# Patient Record
Sex: Male | Born: 1970 | Race: Black or African American | Marital: Single | Smoking: Never smoker
Health system: Southern US, Community
[De-identification: ages and names within clinical notes are randomized; demographics above are authoritative.]

## PROBLEM LIST (undated history)

## (undated) DIAGNOSIS — I10 Essential (primary) hypertension: Secondary | ICD-10-CM

---

## 2017-03-11 ENCOUNTER — Emergency Department: Payer: Self-pay

## 2017-03-11 ENCOUNTER — Observation Stay: Payer: Self-pay

## 2017-03-11 ENCOUNTER — Observation Stay
Admission: EM | Admit: 2017-03-11 | Discharge: 2017-03-12 | Disposition: A | Discharge status: Home / Self Care | Payer: Self-pay | Attending: Internal Medicine | Admitting: Internal Medicine

## 2017-03-11 DIAGNOSIS — Z9114 Patient's other noncompliance with medication regimen: Secondary | ICD-10-CM | POA: Insufficient documentation

## 2017-03-11 DIAGNOSIS — I129 Hypertensive chronic kidney disease with stage 1 through stage 4 chronic kidney disease, or unspecified chronic kidney disease: Secondary | ICD-10-CM | POA: Insufficient documentation

## 2017-03-11 DIAGNOSIS — R109 Unspecified abdominal pain: Secondary | ICD-10-CM

## 2017-03-11 DIAGNOSIS — M549 Dorsalgia, unspecified: Secondary | ICD-10-CM

## 2017-03-11 DIAGNOSIS — N183 Chronic kidney disease, stage 3 unspecified: Secondary | ICD-10-CM

## 2017-03-11 DIAGNOSIS — N200 Calculus of kidney: Secondary | ICD-10-CM

## 2017-03-11 DIAGNOSIS — Z791 Long term (current) use of non-steroidal anti-inflammatories (NSAID): Secondary | ICD-10-CM | POA: Insufficient documentation

## 2017-03-11 DIAGNOSIS — I160 Hypertensive urgency: Principal | ICD-10-CM | POA: Insufficient documentation

## 2017-03-11 DIAGNOSIS — N132 Hydronephrosis with renal and ureteral calculous obstruction: Secondary | ICD-10-CM | POA: Insufficient documentation

## 2017-03-11 DIAGNOSIS — R9431 Abnormal electrocardiogram [ECG] [EKG]: Secondary | ICD-10-CM

## 2017-03-11 DIAGNOSIS — N201 Calculus of ureter: Secondary | ICD-10-CM

## 2017-03-11 DIAGNOSIS — I10 Essential (primary) hypertension: Secondary | ICD-10-CM

## 2017-03-11 HISTORY — DX: Essential (primary) hypertension: I10

## 2017-03-11 LAB — CBC WITH DIFFERENTIAL/PLATELET
BASOS PCT: 1 %
Basophils Absolute: 0.1 10*3/uL (ref 0–0.1)
EOS ABS: 0 10*3/uL (ref 0–0.7)
Eosinophils Relative: 0 %
HCT: 46 % (ref 40.0–52.0)
HEMOGLOBIN: 15.7 g/dL (ref 13.0–18.0)
Lymphocytes Relative: 10 %
Lymphs Abs: 1.6 10*3/uL (ref 1.0–3.6)
MCH: 31.2 pg (ref 26.0–34.0)
MCHC: 34.2 g/dL (ref 32.0–36.0)
MCV: 91.2 fL (ref 80.0–100.0)
MONO ABS: 1.2 10*3/uL — AB (ref 0.2–1.0)
MONOS PCT: 8 %
Neutro Abs: 12.5 10*3/uL — ABNORMAL HIGH (ref 1.4–6.5)
Neutrophils Relative %: 81 %
Platelets: 286 10*3/uL (ref 150–440)
RBC: 5.05 MIL/uL (ref 4.40–5.90)
RDW: 12.8 % (ref 11.5–14.5)
WBC: 15.4 10*3/uL — ABNORMAL HIGH (ref 3.8–10.6)

## 2017-03-11 LAB — COMPREHENSIVE METABOLIC PANEL
ALK PHOS: 57 U/L (ref 38–126)
ALT: 30 U/L (ref 17–63)
AST: 39 U/L (ref 15–41)
Albumin: 4.7 g/dL (ref 3.5–5.0)
Anion gap: 9 (ref 5–15)
BUN: 15 mg/dL (ref 6–20)
CALCIUM: 9.9 mg/dL (ref 8.9–10.3)
CO2: 24 mmol/L (ref 22–32)
CREATININE: 1.46 mg/dL — AB (ref 0.61–1.24)
Chloride: 107 mmol/L (ref 101–111)
GFR, EST NON AFRICAN AMERICAN: 57 mL/min — AB (ref >60)
Glucose, Bld: 132 mg/dL — ABNORMAL HIGH (ref 65–99)
Potassium: 3.9 mmol/L (ref 3.5–5.1)
Sodium: 140 mmol/L (ref 135–145)
Total Bilirubin: 1.4 mg/dL — ABNORMAL HIGH (ref 0.3–1.2)
Total Protein: 7.8 g/dL (ref 6.5–8.1)

## 2017-03-11 LAB — URINALYSIS, COMPLETE (UACMP) WITH MICROSCOPIC
BILIRUBIN URINE: NEGATIVE
Bacteria, UA: NONE SEEN
Glucose, UA: NEGATIVE mg/dL
KETONES UR: 5 mg/dL — AB
Leukocytes, UA: NEGATIVE
Nitrite: NEGATIVE
PROTEIN: 30 mg/dL — AB
Specific Gravity, Urine: 1.028 (ref 1.005–1.030)
pH: 7 (ref 5.0–8.0)

## 2017-03-11 LAB — TROPONIN I: Troponin I: <0.03 ng/mL (ref <0.03)

## 2017-03-11 LAB — LIPASE, BLOOD: LIPASE: 36 U/L (ref 11–51)

## 2017-03-11 MED ORDER — NITROGLYCERIN 2 % TD OINT
2.0000 [in_us] | TOPICAL_OINTMENT | Freq: Four times a day (QID) | TRANSDERMAL | Status: DC
  Administered 2017-03-11 – 2017-03-12 (×4): 2 [in_us] via TOPICAL
  Filled 2017-03-11: qty 30
  Filled 2017-03-11 (×3): qty 2

## 2017-03-11 MED ORDER — HYDROMORPHONE HCL 1 MG/ML IJ SOLN: 1.0000 mg | INTRAMUSCULAR | Status: DC

## 2017-03-11 MED ORDER — ASPIRIN 81 MG PO CHEW
324.0000 mg | CHEWABLE_TABLET | Freq: Once | ORAL | Status: AC
  Administered 2017-03-11: 324 mg via ORAL
  Filled 2017-03-11: qty 4

## 2017-03-11 MED ORDER — DOCUSATE SODIUM 100 MG PO CAPS
100.0000 mg | ORAL_CAPSULE | Freq: Two times a day (BID) | ORAL | Status: DC
  Administered 2017-03-12: 09:00:00 100 mg via ORAL
  Filled 2017-03-11 (×2): qty 1

## 2017-03-11 MED ORDER — TAMSULOSIN HCL 0.4 MG PO CAPS: 0.4000 mg | ORAL_CAPSULE | Freq: Every day | ORAL | Status: DC

## 2017-03-11 MED ORDER — SODIUM CHLORIDE 0.9% FLUSH
3.0000 mL | Freq: Two times a day (BID) | INTRAVENOUS | Status: DC
  Administered 2017-03-11 – 2017-03-12 (×2): 3 mL via INTRAVENOUS

## 2017-03-11 MED ORDER — FENTANYL CITRATE (PF) 100 MCG/2ML IJ SOLN
50.0000 ug | Freq: Once | INTRAMUSCULAR | Status: AC
  Administered 2017-03-11: 50 ug via INTRAVENOUS
  Filled 2017-03-11: qty 2

## 2017-03-11 MED ORDER — HEPARIN SODIUM (PORCINE) 5000 UNIT/ML IJ SOLN
5000.0000 [IU] | Freq: Three times a day (TID) | INTRAMUSCULAR | Status: DC
  Filled 2017-03-11: qty 1

## 2017-03-11 MED ORDER — LABETALOL HCL 200 MG PO TABS
200.0000 mg | ORAL_TABLET | Freq: Two times a day (BID) | ORAL | Status: DC
  Administered 2017-03-11 – 2017-03-12 (×3): 200 mg via ORAL
  Filled 2017-03-11 (×3): qty 1

## 2017-03-11 MED ORDER — ONDANSETRON HCL 4 MG PO TABS: 4.0000 mg | ORAL_TABLET | Freq: Four times a day (QID) | ORAL | Status: DC | PRN

## 2017-03-11 MED ORDER — SODIUM CHLORIDE 0.9% FLUSH
3.0000 mL | INTRAVENOUS | Status: DC | PRN
  Administered 2017-03-11: 17:00:00 3 mL via INTRAVENOUS
  Filled 2017-03-11: qty 3

## 2017-03-11 MED ORDER — TAMSULOSIN HCL 0.4 MG PO CAPS
0.4000 mg | ORAL_CAPSULE | Freq: Every day | ORAL | Status: DC
  Administered 2017-03-11 – 2017-03-12 (×2): 0.4 mg via ORAL
  Filled 2017-03-11 (×2): qty 1

## 2017-03-11 MED ORDER — LEVOFLOXACIN IN D5W 500 MG/100ML IV SOLN
500.0000 mg | INTRAVENOUS | Status: DC
  Administered 2017-03-11: 500 mg via INTRAVENOUS
  Filled 2017-03-11 (×2): qty 100

## 2017-03-11 MED ORDER — LABETALOL HCL 5 MG/ML IV SOLN
10.0000 mg | Freq: Once | INTRAVENOUS | Status: AC
  Administered 2017-03-11: 10 mg via INTRAVENOUS
  Filled 2017-03-11: qty 4

## 2017-03-11 MED ORDER — IOPAMIDOL (ISOVUE-370) INJECTION 76%
125.0000 mL | Freq: Once | INTRAVENOUS | Status: AC | PRN
  Administered 2017-03-11: 125 mL via INTRAVENOUS

## 2017-03-11 MED ORDER — ONDANSETRON HCL 4 MG/2ML IJ SOLN: 4.0000 mg | Freq: Four times a day (QID) | INTRAMUSCULAR | Status: DC | PRN

## 2017-03-11 MED ORDER — SODIUM CHLORIDE 0.9% FLUSH
3.0000 mL | Freq: Two times a day (BID) | INTRAVENOUS | Status: DC
  Administered 2017-03-11: 22:00:00 3 mL via INTRAVENOUS

## 2017-03-11 MED ORDER — FENTANYL CITRATE (PF) 100 MCG/2ML IJ SOLN
50.0000 ug | INTRAMUSCULAR | Status: DC | PRN
  Administered 2017-03-11: 50 ug via INTRAVENOUS
  Filled 2017-03-11: qty 2

## 2017-03-11 MED ORDER — BISACODYL 10 MG RE SUPP: 10.0000 mg | Freq: Every day | RECTAL | Status: DC | PRN

## 2017-03-11 MED ORDER — ACETAMINOPHEN 650 MG RE SUPP: 650.0000 mg | Freq: Four times a day (QID) | RECTAL | Status: DC | PRN

## 2017-03-11 MED ORDER — MORPHINE SULFATE (PF) 2 MG/ML IV SOLN: 2.0000 mg | INTRAVENOUS | Status: DC | PRN

## 2017-03-11 MED ORDER — FAMOTIDINE IN NACL 20-0.9 MG/50ML-% IV SOLN
20.0000 mg | Freq: Two times a day (BID) | INTRAVENOUS | Status: DC
  Administered 2017-03-11 – 2017-03-12 (×2): 20 mg via INTRAVENOUS
  Filled 2017-03-11 (×3): qty 50

## 2017-03-11 MED ORDER — HYDROMORPHONE HCL 1 MG/ML IJ SOLN
0.5000 mg | INTRAMUSCULAR | Status: AC
  Administered 2017-03-11: 0.5 mg via INTRAVENOUS
  Filled 2017-03-11: qty 1

## 2017-03-11 MED ORDER — HYDROMORPHONE HCL 1 MG/ML IJ SOLN: 1.0000 mg | INTRAMUSCULAR | Status: DC | PRN

## 2017-03-11 MED ORDER — ACETAMINOPHEN 325 MG PO TABS: 650.0000 mg | ORAL_TABLET | Freq: Four times a day (QID) | ORAL | Status: DC | PRN

## 2017-03-11 MED ORDER — LABETALOL HCL 5 MG/ML IV SOLN
10.0000 mg | INTRAVENOUS | Status: DC | PRN
  Administered 2017-03-11: 20 mg via INTRAVENOUS
  Administered 2017-03-11: 15:00:00 10 mg via INTRAVENOUS
  Filled 2017-03-11 (×3): qty 4

## 2017-03-11 MED ORDER — SODIUM CHLORIDE 0.9 % IV SOLN
INTRAVENOUS | Status: DC
  Administered 2017-03-11 – 2017-03-12 (×2): via INTRAVENOUS

## 2017-03-11 NOTE — ED Triage Notes (Addendum)
Pt states he goes to the gym and lifts often.  He used a Radio broadcast assistant Monday, lifted Tuesday at the gym, cardio Wednesday.  Pain started last Friday.

## 2017-03-11 NOTE — Progress Notes (Signed)
Recheck BP remains elevated after IV and po labetalol, NTG paste application- 185/92. Text sent to Dr. Judithann Sheen with this report and requested to advise. Mother with pt.

## 2017-03-11 NOTE — ED Notes (Signed)
Pt is experiencing diaphoresis in bed at this time. MD made aware

## 2017-03-11 NOTE — ED Notes (Signed)
Pt back from CT

## 2017-03-11 NOTE — ED Notes (Signed)
Patient placed on zoll  

## 2017-03-11 NOTE — H&P (Signed)
History and Physical    Derek English [name]:[id number] DOB: 19-Nov-1970 DOA: 03/11/2017  Referring physician: Dr. Fanny Bien PCP: No PCP Per Patient  Specialists: none  Chief Complaint: back pain  HPI: Derek English is a 46 y.o. male has a past medical history significant for HTN who presents with worsening acute back pain.No fever. Denies CP or SOB. Pain radiates to groin. No N/V/D. No hematuria. In ER, BP extremely elevated. EKG abnormal but 1st troponin normal. CT angio of chest/abd/pelvis shows right kidney stone with obstruction. He is now admitted. Denies any cardiac hx.  Review of Systems: The patient denies anorexia, fever, weight loss,, vision loss, decreased hearing, hoarseness, chest pain, syncope, dyspnea on exertion, peripheral edema, balance deficits, hemoptysis, abdominal pain, melena, hematochezia, severe indigestion/heartburn, hematuria, incontinence, genital sores, muscle weakness, suspicious skin lesions, transient blindness, difficulty walking, depression, unusual weight change, abnormal bleeding, enlarged lymph nodes, angioedema, and breast masses.   Past Medical History:  Diagnosis Date  . Hypertension    History reviewed. No pertinent surgical history. Social History:  reports that he has never smoked. He has never used smokeless tobacco. His alcohol and drug histories are not on file.  Allergies  Allergen Reactions  . Penicillins     History reviewed. No pertinent family history.  Prior to Admission medications   Medication Sig Start Date End Date Taking? Authorizing Provider  naproxen sodium (ANAPROX) 220 MG tablet Take 440 mg by mouth 2 (two) times daily with a meal.   Yes Historical Provider, MD   Physical Exam: Vitals:   03/11/17 1200 03/11/17 1215 03/11/17 1216 03/11/17 1224  BP:   (!) 191/117   Pulse: 91 79 81 73  Resp: Temp:      TempSrc:      SpO2: 99% 97% 96% 96%  Weight:      Height:         General:  In moderate distress,  [place]/AT, WDWN  Eyes: PERRL, EOMI, no scleral icterus, conjunctiva clear  ENT: moist oropharynx without exudate, TM's benign, dentition good  Neck: supple, no lymphadenopathy. No bruits or thyromegaly  Cardiovascular: regular rate without MRG; 2+ peripheral pulses, no JVD, no peripheral edema  Respiratory: CTA biL, good air movement without wheezing, rhonchi or crackled. Respiratory effort normal  Abdomen: soft, non tender to palpation, positive bowel sounds, no guarding, no rebound  Skin: no rashes or lesions  Musculoskeletal: normal bulk and tone, no joint swelling  Psychiatric: normal mood and affect, A&OX3  Neurologic: CN 2-12 grossly intact, Motor strength 5/5 in all 4 groups with symmetric DTR's and non-focal sensory exam  Labs on Admission:  Basic Metabolic Panel:  Recent Labs Lab 03/11/17 1106  NA 140  K 3.9  CL 107  CO2 24  GLUCOSE 132*  BUN 15  CREATININE 1.46*  CALCIUM 9.9   Liver Function Tests:  Recent Labs Lab 03/11/17 1106  AST 39  ALT 30  ALKPHOS 57  BILITOT 1.4*  PROT 7.8  ALBUMIN 4.7    Recent Labs Lab 03/11/17 1106  LIPASE 36   No results for input(s): AMMONIA in the last 168 hours. CBC:  Recent Labs Lab 03/11/17 1106  WBC 15.4*  NEUTROABS 12.5*  HGB 15.7  HCT 46.0  MCV 91.2  PLT 286   Cardiac Enzymes:  Recent Labs Lab 03/11/17 1106  TROPONINI <0.03    BNP (last 3 results) No results for input(s): BNP in the last 8760 hours.  ProBNP (last 3 results) No  results for input(s): PROBNP in the last 8760 hours.  CBG: No results for input(s): GLUCAP in the last 168 hours.  Radiological Exams on Admission: Ct Angio Chest/abd/pel For Dissection W And/or Wo Contrast  Result Date: 03/11/2017 CLINICAL DATA:  Elevated blood pressure with chest, abdomen, and pelvis pain EXAM: CT ANGIOGRAPHY CHEST, ABDOMEN AND PELVIS TECHNIQUE: Initially, axial CT images were obtained through the chest without intravenous contrast material  administration. Multidetector CT imaging through the chest, abdomen and pelvis was performed using the standard protocol during bolus administration of intravenous contrast. Multiplanar reconstructed images and MIPs were obtained and reviewed to evaluate the vascular anatomy. CONTRAST:  125 mL Isovue 370 nonionic COMPARISON:  None. FINDINGS: CTA CHEST FINDINGS Cardiovascular: There is no intramural hematoma in the thoracic aorta appreciable on the noncontrast enhanced study. There is no appreciable thoracic aortic aneurysm or dissection. Visualized great vessels appear normal. There is no appreciable pericardial effusion. No pulmonary embolus evident. There is left ventricular hypertrophy. Mediastinum/Nodes: Visualized thyroid appears normal. There is no appreciable thoracic adenopathy. Lungs/Pleura: Lungs are clear. No pleural effusion or pleural thickening. Musculoskeletal: No blastic or lytic bone lesions. No chest wall lesion evident. Review of the MIP images confirms the above findings. CTA ABDOMEN AND PELVIS FINDINGS VASCULAR Aorta: There is no appreciable thoracic aortic aneurysm or dissection. There is atherosclerotic calcification in the distal aorta without hemodynamically significant obstruction. Celiac: There is no aneurysm or dissection. No appreciable obstructive disease noted in the celiac axis and major branches. SMA: There is no aneurysm or dissection. No appreciable obstructive disease in the superior mesenteric artery and major branches. Renals: There are single renal arteries bilaterally. There is mild calcification in the proximal right renal artery without hemodynamically significant obstruction. No evidence of aneurysm or dissection. No demonstrable fibromuscular dysplasia. IMA: No aneurysm or dissection. No appreciable obstruction in the inferior mesenteric artery and major branches. Inferior mesenteric artery is rather diminutive in a generalized manner. Inflow: There is atherosclerotic  calcification at the origins of each common iliac artery without hemodynamically significant obstruction. There is mild calcification at the origin of each internal iliac artery. There is slight calcification in the right common femoral artery. No hemodynamically significant obstruction is seen in major pelvic arterial vessels. No aneurysm or dissection. Veins: No obvious venous abnormality within the limitations of this arterial phase study. Review of the MIP images confirms the above findings. NON-VASCULAR Hepatobiliary: No focal liver lesions are appreciable. Gallbladder wall is not appreciably thickened. There is no biliary duct dilatation. Pancreas: No pancreatic mass or inflammatory focus. Spleen: Splenic lesions evident. Adrenals/Urinary Tract: Adrenals appear normal bilaterally. There is perinephric edema diffusely on the right. Right kidney shows decreased enhancement compared to the left side which appears normal. There is no renal mass on either side. There is moderately severe hydronephrosis on the right. No hydronephrosis on the left. There is no intrarenal calculus on either side. There is a calculus measuring 2 mm in size at the right ureterovesical junction. No other ureteral calculi evident. Urinary bladder midline without wall thickening. Stomach/Bowel: No bowel wall or mesenteric thickening. No evident bowel obstruction. No free air or portal venous air. Lymphatic: No adenopathy is appreciable in the abdomen or pelvis. There are a few subcentimeter retroperitoneal lymph nodes which do not meet size criteria for pathologic significance. Reproductive: Prostate and seminal vesicles are normal in size and contour. No pelvic mass. Other: Appendix appears normal. No abscess or ascites evident in the abdomen or pelvis. There is a small ventral  hernia containing only fat. Musculoskeletal: There are no blastic or lytic bone lesions. There is no intramuscular or abdominal wall lesion. Review of the MIP  images confirms the above findings. IMPRESSION: CT angiogram chest: No thoracic aortic aneurysm or dissection. No evident pulmonary embolus. Lungs clear. No adenopathy. There is left ventricular hypertrophy. CT angiogram abdomen and pelvis: There is atherosclerotic calcification in the distal aorta and several pelvic vessels without hemodynamically significant obstruction. No aneurysm or dissection appreciable the aorta or mesenteric and pelvic arterial vessels. 2 mm calculus right ureterovesical junction causing hydronephrosis and ureterectasis on the right as well as right perinephric fluid and edema. Right kidney shows decreased enhancement compared to the left side consistent with the obstructive phenomenon. Small ventral hernia containing only fat. Appendix appears normal.  No abscess or bowel obstruction. These results were called by telephone at the time of interpretation on 03/11/2017 at 12:03 pm to Dr. Sharyn Creamer , who verbally acknowledged these results. Electronically Signed   By: Bretta Bang III M.D.   On: 03/11/2017 12:03    EKG: Independently reviewed.  Assessment/Plan Principal Problem:   Accelerated hypertension Active Problems:   Abnormal EKG   Kidney stone   Acute back pain   Will admit to floor with telemetry and optimize BP. Begin IV fluid and IV ABX with prn IV pain meds. Consult Urology and Cardiology. Order Echo. Follow enzymes. Repeat labs in AM  Diet: clear liquids Fluids: NS@100  DVT Prophylaxis: SQ Heparin  Code Status: FULL  Family Communication: none  Disposition Plan: home  Time spent: 50 min

## 2017-03-11 NOTE — Consult Note (Signed)
[facility] Cardiology  CARDIOLOGY CONSULT NOTE  Patient ID: Derek English MRN: [id number] DOB/AGE: 08/04/1971 46 y.o.  Admit date: 03/11/2017 Referring Physician [facility] Primary Physician  Primary Cardiologist  Reason for Consultation Abnormal ECG  HPI: 46 year old gentleman referred for evaluation of abnormal ECG. The patient was in his usual state of health until approximately a week ago when he first noted right flank and lower back discomfort. Patient had recurrence today, presented to [facility] emergency room. The patient was markedly elevated. ECG revealed sinus rhythm, left ventricular hypertrophy, with nondiagnostic ST elevation in leads aVL, V2 and 3 with downsloping ST depressions inferiorly. The patient denied chest pain. CT scan was performed which revealed obstructive right kidney stone.  Review of systems complete and found to be negative unless listed above     Past Medical History:  Diagnosis Date  . Hypertension     History reviewed. No pertinent surgical history.  Prescriptions Prior to Admission  Medication Sig Dispense Refill Last Dose  . naproxen sodium (ANAPROX) 220 MG tablet Take 440 mg by mouth 2 (two) times daily with a meal.   03/11/2017 at am   Social History   Social History  . Marital status: Single    Spouse name: N/A  . Number of children: N/A  . Years of education: N/A   Occupational History  . Not on file.   Social History Main Topics  . Smoking status: Never Smoker  . Smokeless tobacco: Never Used  . Alcohol use Not on file  . Drug use: Unknown  . Sexual activity: Not on file   Other Topics Concern  . Not on file   Social History Narrative  . No narrative on file    History reviewed. No pertinent family history.    Review of systems complete and found to be negative unless listed above      PHYSICAL EXAM  General: Well developed, well nourished, in no acute distress HEENT:  Normocephalic and atramatic Neck:  No JVD.  Lungs: Clear  bilaterally to auscultation and percussion. Heart: HRRR . Normal S1 and S2 without gallops or murmurs.  Abdomen: Bowel sounds are positive, abdomen soft and non-tender  Msk:  Back normal, normal gait. Normal strength and tone for age. Extremities: No clubbing, cyanosis or edema.   Neuro: Alert and oriented X 3. Psych:  Good affect, responds appropriately  Labs:   Lab Results  Component Value Date   WBC 15.4 (H) 03/11/2017   HGB 15.7 03/11/2017   HCT 46.0 03/11/2017   MCV 91.2 03/11/2017   PLT 286 03/11/2017    Recent Labs Lab 03/11/17 1106  NA 140  K 3.9  CL 107  CO2 24  BUN 15  CREATININE 1.46*  CALCIUM 9.9  PROT 7.8  BILITOT 1.4*  ALKPHOS 57  ALT 30  AST 39  GLUCOSE 132*   Lab Results  Component Value Date   TROPONINI <0.03 03/11/2017   No results found for: CHOL No results found for: HDL No results found for: LDLCALC No results found for: TRIG No results found for: CHOLHDL No results found for: LDLDIRECT    Radiology: Ct Angio Chest/abd/pel For Dissection W And/or Wo Contrast  Result Date: 03/11/2017 CLINICAL DATA:  Elevated blood pressure with chest, abdomen, and pelvis pain EXAM: CT ANGIOGRAPHY CHEST, ABDOMEN AND PELVIS TECHNIQUE: Initially, axial CT images were obtained through the chest without intravenous contrast material administration. Multidetector CT imaging through the chest, abdomen and pelvis was performed using the standard protocol during bolus  administration of intravenous contrast. Multiplanar reconstructed images and MIPs were obtained and reviewed to evaluate the vascular anatomy. CONTRAST:  125 mL Isovue 370 nonionic COMPARISON:  None. FINDINGS: CTA CHEST FINDINGS Cardiovascular: There is no intramural hematoma in the thoracic aorta appreciable on the noncontrast enhanced study. There is no appreciable thoracic aortic aneurysm or dissection. Visualized great vessels appear normal. There is no appreciable pericardial effusion. No pulmonary embolus  evident. There is left ventricular hypertrophy. Mediastinum/Nodes: Visualized thyroid appears normal. There is no appreciable thoracic adenopathy. Lungs/Pleura: Lungs are clear. No pleural effusion or pleural thickening. Musculoskeletal: No blastic or lytic bone lesions. No chest wall lesion evident. Review of the MIP images confirms the above findings. CTA ABDOMEN AND PELVIS FINDINGS VASCULAR Aorta: There is no appreciable thoracic aortic aneurysm or dissection. There is atherosclerotic calcification in the distal aorta without hemodynamically significant obstruction. Celiac: There is no aneurysm or dissection. No appreciable obstructive disease noted in the celiac axis and major branches. SMA: There is no aneurysm or dissection. No appreciable obstructive disease in the superior mesenteric artery and major branches. Renals: There are single renal arteries bilaterally. There is mild calcification in the proximal right renal artery without hemodynamically significant obstruction. No evidence of aneurysm or dissection. No demonstrable fibromuscular dysplasia. IMA: No aneurysm or dissection. No appreciable obstruction in the inferior mesenteric artery and major branches. Inferior mesenteric artery is rather diminutive in a generalized manner. Inflow: There is atherosclerotic calcification at the origins of each common iliac artery without hemodynamically significant obstruction. There is mild calcification at the origin of each internal iliac artery. There is slight calcification in the right common femoral artery. No hemodynamically significant obstruction is seen in major pelvic arterial vessels. No aneurysm or dissection. Veins: No obvious venous abnormality within the limitations of this arterial phase study. Review of the MIP images confirms the above findings. NON-VASCULAR Hepatobiliary: No focal liver lesions are appreciable. Gallbladder wall is not appreciably thickened. There is no biliary duct dilatation.  Pancreas: No pancreatic mass or inflammatory focus. Spleen: Splenic lesions evident. Adrenals/Urinary Tract: Adrenals appear normal bilaterally. There is perinephric edema diffusely on the right. Right kidney shows decreased enhancement compared to the left side which appears normal. There is no renal mass on either side. There is moderately severe hydronephrosis on the right. No hydronephrosis on the left. There is no intrarenal calculus on either side. There is a calculus measuring 2 mm in size at the right ureterovesical junction. No other ureteral calculi evident. Urinary bladder midline without wall thickening. Stomach/Bowel: No bowel wall or mesenteric thickening. No evident bowel obstruction. No free air or portal venous air. Lymphatic: No adenopathy is appreciable in the abdomen or pelvis. There are a few subcentimeter retroperitoneal lymph nodes which do not meet size criteria for pathologic significance. Reproductive: Prostate and seminal vesicles are normal in size and contour. No pelvic mass. Other: Appendix appears normal. No abscess or ascites evident in the abdomen or pelvis. There is a small ventral hernia containing only fat. Musculoskeletal: There are no blastic or lytic bone lesions. There is no intramuscular or abdominal wall lesion. Review of the MIP images confirms the above findings. IMPRESSION: CT angiogram chest: No thoracic aortic aneurysm or dissection. No evident pulmonary embolus. Lungs clear. No adenopathy. There is left ventricular hypertrophy. CT angiogram abdomen and pelvis: There is atherosclerotic calcification in the distal aorta and several pelvic vessels without hemodynamically significant obstruction. No aneurysm or dissection appreciable the aorta or mesenteric and pelvic arterial vessels. 2 mm calculus  right ureterovesical junction causing hydronephrosis and ureterectasis on the right as well as right perinephric fluid and edema. Right kidney shows decreased enhancement  compared to the left side consistent with the obstructive phenomenon. Small ventral hernia containing only fat. Appendix appears normal.  No abscess or bowel obstruction. These results were called by telephone at the time of interpretation on 03/11/2017 at 12:03 pm to Dr. Sharyn Creamer , who verbally acknowledged these results. Electronically Signed   By: Bretta Bang III M.D.   On: 03/11/2017 12:03    EKG: Normal sinus rhythm, LVH, nondiagnostic ST elevation leads aVL, V2 and 3  ASSESSMENT AND PLAN:   1. Abnormal ECG, with non-diagnostic ST elevation, in the absence of chest pain, with normal troponin, in the setting of obstructive right kidney stone 2. Accelerated hypertension, in patient with known essential hypertension, been noncompliant with antihypertensive medications  Recommendations  1. Agree with overall current therapy 2. Defer full dose anticoagulation 3. Defer emergent cardiac catheterization  4. Review 2-D echocardiogram 5. Cycle cardiac enzymes 6. Start labetalol 200 mg twice a day  Signed: Marcina Millard MD,PhD, [facility] 03/11/2017, 2:52 PM

## 2017-03-11 NOTE — ED Triage Notes (Addendum)
Pt states waking up this morning with severe right flank pain.  Pt states never having any pain like this before.  Pt states no problems urinating.  Denies SOB

## 2017-03-11 NOTE — Consult Note (Signed)
Urology Consult  I have been asked to see the patient by Dr. Judithann English, for evaluation and management of ureteral stone.  Chief Complaint: right flank pain  History of Present Illness: Derek English is a 46 y.o. year old admitted with severe right flank pain and severe hypertension. Patient reports having intermittent right flank pain for the past week which is coming gone. This morning, he woke up around 5 AM with 10 out of 10 flank pain.  He denies any fevers chills, nausea, vomiting, dysuria, or any other signs or symptoms of infection.  In the setting of extreme hypertension and diffuse abdominal pain, the patient underwent CT AG English the chest abdomen and pelvis which revealed a 2 mm right UVJ stone with proximal hydroureteronephrosis and some mild perinephric stranding. He also had a mildly elevated leukocytosis, WBC 15, an elevated creatinine to ~1.5 (baseline unknown). UA was remarkable only for presence of red blood cells.   Since admission, he reports that he has not needed re-dosing of his pain meds for almost 12 hours and feels quite comfortable. Follow-up KUB shows mild fullness of the right collecting system with excretion of contrast previously received during CT scan (stone possibly obscured by contrast).  He has been straining his urine but not seen the stone pass.  He denies a personal history of kidney stones. He does not have a PCP and has not seen a physician in many years.  Past Medical History:  Diagnosis Date  . Hypertension     History reviewed. No pertinent surgical history.  Home Medications:  Current Meds  Medication Sig  . naproxen sodium (ANAPROX) 220 MG tablet Take 440 mg by mouth 2 (two) times daily with a meal.    Allergies:  Allergies  Allergen Reactions  . Penicillins     History reviewed. No pertinent family history.  Social History:  reports that he has never smoked. He has never used smokeless tobacco. His alcohol and drug histories are  not on file.  ROS: A complete review of systems was performed.  All systems are negative except for pertinent findings as noted.  Physical Exam:  Vital signs in last 24 hours: Temp:  [97.6 F (36.4 C)-97.7 F (36.5 C)] 97.7 F (36.5 C) (04/28 2005) Pulse Rate:  [66-91] 70 (04/28 2005) Resp:  [14-31] 20 (04/28 2005) BP: (166-250)/(92-195) 176/96 (04/28 2005) SpO2:  [94 %-100 %] 99 % (04/28 2005) Weight:  [300 lb (136.1 kg)-313 lb 3.2 oz (142.1 kg)] 313 lb 3.2 oz (142.1 kg) (04/28 1441) Constitutional:  Alert and oriented, No acute distress.  Sister English bedside.   HEENT: [place] English, moist mucus membranes.  Trachea midline, no masses Cardiovascular: No clubbing, cyanosis, or edema. Respiratory: Normal respiratory effort, lungs clear bilaterally GI: Abdomen is soft, nontender, nondistended, no abdominal masses.  Obese.   GU: No CVA tenderness Skin: No rashes, bruises or suspicious lesions Neurologic: Grossly intact, no focal deficits, moving all 4 extremities Psychiatric: Normal mood and affect   Laboratory Data:   Recent Labs  03/11/17 1106  WBC 15.4*  HGB 15.7  HCT 46.0    Recent Labs  03/11/17 1106  NA 140  K 3.9  CL 107  CO2 24  GLUCOSE 132*  BUN 15  CREATININE 1.46*  CALCIUM 9.9   Results for Derek English (MRN [id number]) as of 03/11/2017 20:54  Ref. Range 03/11/2017 11:49  Appearance Latest Ref Range: CLEAR  CLEAR (A)  Bacteria, UA Latest Ref Range: NONE SEEN  NONE SEEN  Bilirubin Urine Latest Ref Range: NEGATIVE  NEGATIVE  Color, Urine Latest Ref Range: YELLOW  YELLOW (A)  Glucose Latest Ref Range: NEGATIVE mg/dL NEGATIVE  Hgb urine dipstick Latest Ref Range: NEGATIVE  LARGE (A)  Hyaline Casts, UA Unknown PRESENT  Ketones, ur Latest Ref Range: NEGATIVE mg/dL 5 (A)  Leukocytes, UA Latest Ref Range: NEGATIVE  NEGATIVE  Mucous Unknown PRESENT  Nitrite Latest Ref Range: NEGATIVE  NEGATIVE  pH Latest Ref Range: 5.0 - 8.0  7.0  Protein Latest Ref Range: NEGATIVE  mg/dL 30 (A)  RBC / HPF Latest Ref Range: 0 - 5 RBC/hpf TOO NUMEROUS TO C...  Specific Gravity, Urine Latest Ref Range: 1.005 - 1.030  1.028  Squamous Epithelial / LPF Latest Ref Range: NONE SEEN  0-5 (A)  WBC, UA Latest Ref Range: 0 - 5 WBC/hpf 0-5    Radiologic Imaging: Dg Abd 1 View  Result Date: 03/11/2017 CLINICAL DATA:  Right flank region pain EXAM: ABDOMEN - 1 VIEW COMPARISON:  CT abdomen and pelvis obtained earlier in the day FINDINGS: There is contrast in the renal collecting systems and urinary bladder. There is mild dilatation of the right collecting system and ureter. No calculus is evident by radiography currently. There is moderate stool in the colon. There is no bowel dilatation or air-fluid level suggesting bowel obstruction. No free air. IMPRESSION: Mild fullness of the right ureter and collecting system. No calculus is seen by radiography. The presence of intravenous contrast could mask a small calculus. No bowel obstruction or free air evident. Electronically Signed   By: Bretta Bang III M.D.   On: 03/11/2017 16:30   Ct Angio Chest/abd/pel For Dissection W And/or Wo Contrast  Result Date: 03/11/2017 CLINICAL DATA:  Elevated blood pressure with chest, abdomen, and pelvis pain EXAM: CT ANGIOGRAPHY CHEST, ABDOMEN AND PELVIS TECHNIQUE: Initially, axial CT images were obtained through the chest without intravenous contrast material administration. Multidetector CT imaging through the chest, abdomen and pelvis was performed using the standard protocol during bolus administration of intravenous contrast. Multiplanar reconstructed images and MIPs were obtained and reviewed to evaluate the vascular anatomy. CONTRAST:  125 mL Isovue 370 nonionic COMPARISON:  None. FINDINGS: CTA CHEST FINDINGS Cardiovascular: There is no intramural hematoma in the thoracic aorta appreciable on the noncontrast enhanced study. There is no appreciable thoracic aortic aneurysm or dissection. Visualized great  vessels appear normal. There is no appreciable pericardial effusion. No pulmonary embolus evident. There is left ventricular hypertrophy. Mediastinum/Nodes: Visualized thyroid appears normal. There is no appreciable thoracic adenopathy. Lungs/Pleura: Lungs are clear. No pleural effusion or pleural thickening. Musculoskeletal: No blastic or lytic bone lesions. No chest wall lesion evident. Review of the MIP images confirms the above findings. CTA ABDOMEN AND PELVIS FINDINGS VASCULAR Aorta: There is no appreciable thoracic aortic aneurysm or dissection. There is atherosclerotic calcification in the distal aorta without hemodynamically significant obstruction. Celiac: There is no aneurysm or dissection. No appreciable obstructive disease noted in the celiac axis and major branches. SMA: There is no aneurysm or dissection. No appreciable obstructive disease in the superior mesenteric artery and major branches. Renals: There are single renal arteries bilaterally. There is mild calcification in the proximal right renal artery without hemodynamically significant obstruction. No evidence of aneurysm or dissection. No demonstrable fibromuscular dysplasia. IMA: No aneurysm or dissection. No appreciable obstruction in the inferior mesenteric artery and major branches. Inferior mesenteric artery is rather diminutive in a generalized manner. Inflow: There is atherosclerotic calcification English the origins  of each common iliac artery without hemodynamically significant obstruction. There is mild calcification English the origin of each internal iliac artery. There is slight calcification in the right common femoral artery. No hemodynamically significant obstruction is seen in major pelvic arterial vessels. No aneurysm or dissection. Veins: No obvious venous abnormality within the limitations of this arterial phase study. Review of the MIP images confirms the above findings. NON-VASCULAR Hepatobiliary: No focal liver lesions are  appreciable. Gallbladder wall is not appreciably thickened. There is no biliary duct dilatation. Pancreas: No pancreatic mass or inflammatory focus. Spleen: Splenic lesions evident. Adrenals/Urinary Tract: Adrenals appear normal bilaterally. There is perinephric edema diffusely on the right. Right kidney shows decreased enhancement compared to the left side which appears normal. There is no renal mass on either side. There is moderately severe hydronephrosis on the right. No hydronephrosis on the left. There is no intrarenal calculus on either side. There is a calculus measuring 2 mm in size English the right ureterovesical junction. No other ureteral calculi evident. Urinary bladder midline without wall thickening. Stomach/Bowel: No bowel wall or mesenteric thickening. No evident bowel obstruction. No free air or portal venous air. Lymphatic: No adenopathy is appreciable in the abdomen or pelvis. There are a few subcentimeter retroperitoneal lymph nodes which do not meet size criteria for pathologic significance. Reproductive: Prostate and seminal vesicles are normal in size and contour. No pelvic mass. Other: Appendix appears normal. No abscess or ascites evident in the abdomen or pelvis. There is a small ventral hernia containing only fat. Musculoskeletal: There are no blastic or lytic bone lesions. There is no intramuscular or abdominal wall lesion. Review of the MIP images confirms the above findings. IMPRESSION: CT angiogram chest: No thoracic aortic aneurysm or dissection. No evident pulmonary embolus. Lungs clear. No adenopathy. There is left ventricular hypertrophy. CT angiogram abdomen and pelvis: There is atherosclerotic calcification in the distal aorta and several pelvic vessels without hemodynamically significant obstruction. No aneurysm or dissection appreciable the aorta or mesenteric and pelvic arterial vessels. 2 mm calculus right ureterovesical junction causing hydronephrosis and ureterectasis on the  right as well as right perinephric fluid and edema. Right kidney shows decreased enhancement compared to the left side consistent with the obstructive phenomenon. Small ventral hernia containing only fat. Appendix appears normal.  No abscess or bowel obstruction. These results were called by telephone English the time of interpretation on 03/11/2017 English 12:03 pm to Dr. Sharyn Creamer , who verbally acknowledged these results. Electronically Signed   By: Bretta Bang III M.D.   On: 03/11/2017 12:03   CT scan and KUB personal review today.  Assessment/Plan:  46 year old male with 2 mm right UVJ stone and extreme hypertension.  1. Right distal ureteral stone-given the very small size of stone and proximity to bladder, highly likely that he'll pass this spontaneously without need for intervention. Additionally, he has been pain free for 12 hours which is highly suggestive of the stone is now pass into the bladder. Difficult to follow with KUB The very small size.  Continue to strain urine.  If he remains pain-free, okay to discharge from urologic standpoint. Recommend continuation of Flomax for a week to help with comfort.  2. CKD- baseline renal function unknown, unclear if this is acute kidney injury from obstruction and NSAID use versus chronic kidney disease.  I highly recommend establishing care with her PCP which was discussed with the patient.  3. HTN- patient reports that this is chronic and has been poorly controlled as  outpatient. Hypertension likely previously exacerbated by pain which is now resolved.  Okay for discharge from urologic standpoint as long as he remains pain-free.   03/11/2017, 8:54 PM  Vanna Scotland,  MD

## 2017-03-11 NOTE — Progress Notes (Signed)
RTC from Dr. Judithann Sheen with update report given with current BP reading and current meds given- advised to continue same plan of care with no new orders; pleased with progress in reducing blood pressure. Asymptomatic.

## 2017-03-11 NOTE — ED Provider Notes (Signed)
[facility] Emergency Department Provider Note   ____________________________________________   First MD Initiated Contact with Patient 03/11/17 1129     (approximate)  I have reviewed the triage vital signs and the nursing notes.   HISTORY  Chief Complaint Flank Pain    HPI Derek English is a 46 y.o. male reports he began experiencing pain in his right mid back and also some in the right flank about one week ago. This went away for the week, then at about 9:52 AM this morning he began experiencing pain again in his mid back. He now reports having a severe, excruciating pain pointing towards his lower thoracic mid back and also slightly towards the right flank. He denies chest pain. Denies shortness of breath. He does report severe pain and he feels very "sweaty".  Took ibuprofen a week ago for similar discomfort which alleviated his pain.  He denies any previous medical history.  Denies any blood in his urine. No pain in his groin. Denies abdominal pain. No numbness tingling or weakness.  Severe, 10 out of 10 with severe worsening of this discomfort and pain about 10 AM.   Past Medical History:  Diagnosis Date  . Hypertension     There are no active problems to display for this patient.   History reviewed. No pertinent surgical history.  Prior to Admission medications   Medication Sig Start Date End Date Taking? Authorizing Provider  naproxen sodium (ANAPROX) 220 MG tablet Take 440 mg by mouth 2 (two) times daily with a meal.   Yes Historical Provider, MD    Allergies Penicillins  History reviewed. No pertinent family history.  Social History Social History  Substance Use Topics  . Smoking status: Not on file  . Smokeless tobacco: Not on file  . Alcohol use Not on file    Review of Systems Constitutional: No fever/chills Eyes: No visual changes. ENT: No sore throat. Cardiovascular: Denies chest pain. Respiratory: Denies  shortness of breath. Gastrointestinal: No abdominal pain.  No nausea, no vomiting.  No diarrhea.  No constipation. Genitourinary: Negative for dysuria. Musculoskeletal: See history of present illness Skin: Negative for rash. Neurological: Negative for headaches, focal weakness or numbness.  10-point ROS otherwise negative.  ____________________________________________   PHYSICAL EXAM:  VITAL SIGNS: ED Triage Vitals  Enc Vitals Group     BP 03/11/17 1052 (!) 250/195     Pulse Rate 03/11/17 1049 90     Resp 03/11/17 1049 20     Temp 03/11/17 1049 97.6 F (36.4 C)     Temp Source 03/11/17 1049 Oral     SpO2 03/11/17 1049 100 %     Weight 03/11/17 1050 300 lb (136.1 kg)     Height 03/11/17 1050 6' (1.829 m)     Head Circumference --      Peak Flow --      Pain Score 03/11/17 1048 9     Pain Loc --      Pain Edu? --      Excl. in GC? --     Constitutional: Alert and oriented. Appears in pain, reporting severe pain in the mid back. He does report some relief after having received fentanyl. Eyes: Conjunctivae are normal. PERRL. EOMI. Head: Atraumatic. Nose: No congestion/rhinnorhea. Mouth/Throat: Mucous membranes are moist.  Oropharynx non-erythematous. Neck: No stridor.   Cardiovascular: Normal rate, regular rhythm. Grossly normal heart sounds.  Good peripheral circulation. Palpable dorsalis pedis pulses bilaterally. Bilateral blood pressures in the arms equivalent.  Respiratory: Normal respiratory effort.  No retractions. Lungs CTAB. Gastrointestinal: Soft and nontender. No distention.  Musculoskeletal: No lower extremity tenderness nor edema.  Neurologic:  Normal speech and language. No gross focal neurologic deficits are appreciated. Skin:  Skin is warm, dry and intact. No rash noted. Psychiatric: Mood and affect are normal. Speech and behavior are normal.  ____________________________________________   LABS (all labs ordered are listed, but only abnormal results are  displayed)  Labs Reviewed  CBC WITH DIFFERENTIAL/PLATELET - Abnormal; Notable for the following:       Result Value   WBC 15.4 (*)    Neutro Abs 12.5 (*)    Monocytes Absolute 1.2 (*)    All other components within normal limits  COMPREHENSIVE METABOLIC PANEL - Abnormal; Notable for the following:    Glucose, Bld 132 (*)    Creatinine, Ser 1.46 (*)    Total Bilirubin 1.4 (*)    GFR calc non Af Amer 57 (*)    All other components within normal limits  URINALYSIS, COMPLETE (UACMP) WITH MICROSCOPIC - Abnormal; Notable for the following:    Color, Urine YELLOW (*)    APPearance CLEAR (*)    Hgb urine dipstick LARGE (*)    Ketones, ur 5 (*)    Protein, ur 30 (*)    Squamous Epithelial / LPF 0-5 (*)    All other components within normal limits  LIPASE, BLOOD  TROPONIN I   ____________________________________________  EKG  Reviewed and interpreted by me at 11:20 AM Heart rate 90 QRS 105 QTC 440 Normal sinus rhythm There is evidence of ST elevation notable in aVL, V2, V3, with significant depressions in inferior and lateral distribution  The EKG is concerning for an acute ischemic event, however no previous EKG is available for comparison. This EKG was discussed and reviewed with Dr. Delrae Sawyers (STEMI MD) who reviewed it with me at 11:25 AM ____________________________________________  RADIOLOGY  Ct Angio Chest/abd/pel For Dissection W And/or Wo Contrast  Result Date: 03/11/2017 CLINICAL DATA:  Elevated blood pressure with chest, abdomen, and pelvis pain EXAM: CT ANGIOGRAPHY CHEST, ABDOMEN AND PELVIS TECHNIQUE: Initially, axial CT images were obtained through the chest without intravenous contrast material administration. Multidetector CT imaging through the chest, abdomen and pelvis was performed using the standard protocol during bolus administration of intravenous contrast. Multiplanar reconstructed images and MIPs were obtained and reviewed to evaluate the vascular anatomy.  CONTRAST:  125 mL Isovue 370 nonionic COMPARISON:  None. FINDINGS: CTA CHEST FINDINGS Cardiovascular: There is no intramural hematoma in the thoracic aorta appreciable on the noncontrast enhanced study. There is no appreciable thoracic aortic aneurysm or dissection. Visualized great vessels appear normal. There is no appreciable pericardial effusion. No pulmonary embolus evident. There is left ventricular hypertrophy. Mediastinum/Nodes: Visualized thyroid appears normal. There is no appreciable thoracic adenopathy. Lungs/Pleura: Lungs are clear. No pleural effusion or pleural thickening. Musculoskeletal: No blastic or lytic bone lesions. No chest wall lesion evident. Review of the MIP images confirms the above findings. CTA ABDOMEN AND PELVIS FINDINGS VASCULAR Aorta: There is no appreciable thoracic aortic aneurysm or dissection. There is atherosclerotic calcification in the distal aorta without hemodynamically significant obstruction. Celiac: There is no aneurysm or dissection. No appreciable obstructive disease noted in the celiac axis and major branches. SMA: There is no aneurysm or dissection. No appreciable obstructive disease in the superior mesenteric artery and major branches. Renals: There are single renal arteries bilaterally. There is mild calcification in the proximal right renal artery without hemodynamically  significant obstruction. No evidence of aneurysm or dissection. No demonstrable fibromuscular dysplasia. IMA: No aneurysm or dissection. No appreciable obstruction in the inferior mesenteric artery and major branches. Inferior mesenteric artery is rather diminutive in a generalized manner. Inflow: There is atherosclerotic calcification at the origins of each common iliac artery without hemodynamically significant obstruction. There is mild calcification at the origin of each internal iliac artery. There is slight calcification in the right common femoral artery. No hemodynamically significant  obstruction is seen in major pelvic arterial vessels. No aneurysm or dissection. Veins: No obvious venous abnormality within the limitations of this arterial phase study. Review of the MIP images confirms the above findings. NON-VASCULAR Hepatobiliary: No focal liver lesions are appreciable. Gallbladder wall is not appreciably thickened. There is no biliary duct dilatation. Pancreas: No pancreatic mass or inflammatory focus. Spleen: Splenic lesions evident. Adrenals/Urinary Tract: Adrenals appear normal bilaterally. There is perinephric edema diffusely on the right. Right kidney shows decreased enhancement compared to the left side which appears normal. There is no renal mass on either side. There is moderately severe hydronephrosis on the right. No hydronephrosis on the left. There is no intrarenal calculus on either side. There is a calculus measuring 2 mm in size at the right ureterovesical junction. No other ureteral calculi evident. Urinary bladder midline without wall thickening. Stomach/Bowel: No bowel wall or mesenteric thickening. No evident bowel obstruction. No free air or portal venous air. Lymphatic: No adenopathy is appreciable in the abdomen or pelvis. There are a few subcentimeter retroperitoneal lymph nodes which do not meet size criteria for pathologic significance. Reproductive: Prostate and seminal vesicles are normal in size and contour. No pelvic mass. Other: Appendix appears normal. No abscess or ascites evident in the abdomen or pelvis. There is a small ventral hernia containing only fat. Musculoskeletal: There are no blastic or lytic bone lesions. There is no intramuscular or abdominal wall lesion. Review of the MIP images confirms the above findings. IMPRESSION: CT angiogram chest: No thoracic aortic aneurysm or dissection. No evident pulmonary embolus. Lungs clear. No adenopathy. There is left ventricular hypertrophy. CT angiogram abdomen and pelvis: There is atherosclerotic calcification  in the distal aorta and several pelvic vessels without hemodynamically significant obstruction. No aneurysm or dissection appreciable the aorta or mesenteric and pelvic arterial vessels. 2 mm calculus right ureterovesical junction causing hydronephrosis and ureterectasis on the right as well as right perinephric fluid and edema. Right kidney shows decreased enhancement compared to the left side consistent with the obstructive phenomenon. Small ventral hernia containing only fat. Appendix appears normal.  No abscess or bowel obstruction. These results were called by telephone at the time of interpretation on 03/11/2017 at 12:03 pm to Dr. Sharyn Creamer , who verbally acknowledged these results. Electronically Signed   By: Bretta Bang III M.D.   On: 03/11/2017 12:03    ____________________________________________   PROCEDURES  Procedure(s) performed: None  Procedures  Critical Care performed: Yes, see critical care note(s)  CRITICAL CARE Performed by: Sharyn Creamer   Total critical care time: 40 minutes  Critical care time was exclusive of separately billable procedures and treating other patients.  Critical care was necessary to treat or prevent imminent or life-threatening deterioration.  Critical care was time spent personally by me on the following activities: development of treatment plan with patient and/or surrogate as well as nursing, discussions with consultants, evaluation of patient's response to treatment, examination of patient, obtaining history from patient or surrogate, ordering and performing treatments and interventions, ordering and  review of laboratory studies, ordering and review of radiographic studies, pulse oximetry and re-evaluation of patient's condition.  Patient with severe hypertension with associated severe back pain requiring immediate evaluation for exclusion of aortic dissection and possible STEMI. Patient was felt to be at significant risk for cardiovascular  collapse at the time of initial presentation requiring my immediate evaluation and workup.  ____________________________________________   INITIAL IMPRESSION / ASSESSMENT AND PLAN / ED COURSE  Pertinent labs & imaging results that were available during my care of the patient were reviewed by me and considered in my medical decision making (see chart for details).  Differential diagnosis is broad, however primary concern given history and elevated blood pressures that of possible thoracic or abdominal aortic dissection. In addition, EKG is concerning for possible ST elevation MI, however his symptoms are atypical as he does not endorse any chest pain or dyspnea. He endorses a severe pain in his back at this time with no abdominal pain or chest pain. Discussed with Dr. Cassie Freer, advises proceeding with CT angiography if no obvious explanation such as a dissection, then would proceed with STEMI activation.  I am presently providing pain relief, withholding aspirin and anti-coagulants at this time in the event of a dissection which is felt to be highly likely given symptomatology.   Clinical Course as of Mar 11 1244  Sat Mar 11, 2017  1131 Reviewing history and EKG with Dr. Cassie Freer at this time.  [MQ]  1131 Dr. Cassie Freer. Dr. Cassie Freer   [MQ]  1136 Dr. Cassie Freer recommends rule out dissection (first), if no dissection then activate as STEMI. I agree. History very concerning for dissection but STEMI is also strongly considered, except the patient denies having any chest pain, reports all pain in the mid back with radiation to the flank.  [MQ]  1143 Feel the benefits of IV contrast in making a potentially life-threatening diagnosis of aortic dissection outweigh the benefits of awaiting a creatinine at this time. Patient has no prior medical history, and his symptoms today seem extremely concerning for dissection.   [MQ]    Clinical Course User Index [MQ] Sharyn Creamer, MD     ----------------------------------------- 11:43 AM on 03/11/2017 -----------------------------------------  Patient at CT scan.  CT results discussed with radiologist Dr. Margarita Grizzle. In addition discussed with Dr. Cassie Freer. We will admit the patient, appears likely that he is suffering from a kidney stone however, he also has severe hypertension with concerning EKG changes. His troponin is normal and given his symptomatology it appears that his pain is from an acute kidney stone and not from acute cardiac disease. He is responded well to blood pressure medication IV as well as IV narcotics for pain control with his blood pressure now improving to 191/117, a marked improvement from his high of 250 systolic.  Patient is agreeable to plan for admission for cardiac rule out, pain control due to kidney stone, and management of hypertensive urgency.  ----------------------------------------- 12:45 PM on 03/11/2017 -----------------------------------------  Patient reports his pain is improved to about a 3 out of 10. He is now much more comfortable. Appears much improved. ____________________________________________   FINAL CLINICAL IMPRESSION(S) / ED DIAGNOSES  Final diagnoses:  Right flank pain  Kidney stone  Hypertensive urgency  ST elevation on ECG      NEW MEDICATIONS STARTED DURING THIS VISIT:  New Prescriptions   No medications on file     Note:  This document was prepared using Dragon voice recognition software and may include unintentional dictation errors.  Sharyn Creamer, MD 03/11/17 1245

## 2017-03-11 NOTE — Progress Notes (Signed)
Urology  Unit clerk consult received. Reviewed imaging, 2 mm right UVJ stone with hydroureteronephrosis. Creatinine elevated to approximately 1.5, unknown baseline, otherwise no signs or symptoms of infection other than mild probably reactive leukocytosis. UA negative other than for blood.  Based on size and location of the stone, suspect stone will pass spontaneously with medical expulsive therapy.  Recommend strain urine, flomax, KUB to follow stone.  Call if clinically decompensates or spikes fevers.   Will see patient later this evening.    Patient is currently not NPO and undergoing cardiac evaluation therefore no urgent intervention planned for today.     Vanna Scotland, MD

## 2017-03-12 LAB — COMPREHENSIVE METABOLIC PANEL
ALT: 23 U/L (ref 17–63)
ANION GAP: 7 (ref 5–15)
AST: 30 U/L (ref 15–41)
Albumin: 3.5 g/dL (ref 3.5–5.0)
Alkaline Phosphatase: 42 U/L (ref 38–126)
BILIRUBIN TOTAL: 1 mg/dL (ref 0.3–1.2)
BUN: 12 mg/dL (ref 6–20)
CO2: 26 mmol/L (ref 22–32)
Calcium: 8.9 mg/dL (ref 8.9–10.3)
Chloride: 105 mmol/L (ref 101–111)
Creatinine, Ser: 1.1 mg/dL (ref 0.61–1.24)
Glucose, Bld: 83 mg/dL (ref 65–99)
POTASSIUM: 3.1 mmol/L — AB (ref 3.5–5.1)
Sodium: 138 mmol/L (ref 135–145)
TOTAL PROTEIN: 6.4 g/dL — AB (ref 6.5–8.1)

## 2017-03-12 LAB — CBC
HEMATOCRIT: 39.5 % — AB (ref 40.0–52.0)
HEMOGLOBIN: 13.5 g/dL (ref 13.0–18.0)
MCH: 31.2 pg (ref 26.0–34.0)
MCHC: 34 g/dL (ref 32.0–36.0)
MCV: 91.6 fL (ref 80.0–100.0)
Platelets: 242 10*3/uL (ref 150–440)
RBC: 4.32 MIL/uL — AB (ref 4.40–5.90)
RDW: 12.8 % (ref 11.5–14.5)
WBC: 10.6 10*3/uL (ref 3.8–10.6)

## 2017-03-12 LAB — HIV ANTIBODY (ROUTINE TESTING W REFLEX): HIV Screen 4th Generation wRfx: NONREACTIVE

## 2017-03-12 LAB — TROPONIN I

## 2017-03-12 MED ORDER — LISINOPRIL-HYDROCHLOROTHIAZIDE 10-12.5 MG PO TABS: 1.0000 | ORAL_TABLET | Freq: Every day | ORAL | 0 refills | Status: AC

## 2017-03-12 MED ORDER — POTASSIUM CHLORIDE CRYS ER 20 MEQ PO TBCR
40.0000 meq | EXTENDED_RELEASE_TABLET | ORAL | Status: AC
  Administered 2017-03-12 (×2): 40 meq via ORAL
  Filled 2017-03-12 (×2): qty 2

## 2017-03-12 MED ORDER — TAMSULOSIN HCL 0.4 MG PO CAPS: 0.4000 mg | ORAL_CAPSULE | Freq: Every day | ORAL | 0 refills | Status: DC

## 2017-03-12 NOTE — Progress Notes (Signed)
Oral and written AVS instructions done with pt verbalizing understanding. Prescriptions given for zestoretic, flomax and naprosyn. Pt was given discount card and coupons to assist with prescriptions. Stressed importance of establishing care with PCP for which he now has resources with continued HTN care indicated.

## 2017-03-12 NOTE — Plan of Care (Signed)
Problem: Education: Goal: Knowledge of [facility] Education information/materials will improve VSS, free of falls during shift.  No complaints overnight.  Bed in low position, call bell within reach.  WCTM.

## 2017-03-12 NOTE — Progress Notes (Signed)
[facility] Cardiology  SUBJECTIVE: I don't have chest pain   Vitals:   03/11/17 2005 03/12/17 0500 03/12/17 0525 03/12/17 0841  BP: (!) 176/96  (!) 158/84 (!) 165/86  Pulse: 70  75 72  Resp: Temp: 97.7 F (36.5 C)  98.4 F (36.9 C) 98.1 F (36.7 C)  TempSrc: Oral  Oral Oral  SpO2: 99%  98% 100%  Weight:  (!) 142.7 kg (314 lb 9.6 oz)    Height:         Intake/Output Summary (Last 24 hours) at 03/12/17 1105 Last data filed at 03/12/17 1010  Gross per 24 hour  Intake             3108 ml  Output             1125 ml  Net             1983 ml      PHYSICAL EXAM  General: Well developed, well nourished, in no acute distress HEENT:  Normocephalic and atramatic Neck:  No JVD.  Lungs: Clear bilaterally to auscultation and percussion. Heart: HRRR . Normal S1 and S2 without gallops or murmurs.  Abdomen: Bowel sounds are positive, abdomen soft and non-tender  Msk:  Back normal, normal gait. Normal strength and tone for age. Extremities: No clubbing, cyanosis or edema.   Neuro: Alert and oriented X 3. Psych:  Good affect, responds appropriately   LABS: Basic Metabolic Panel:  Recent Labs  66/44/03 1106 03/12/17 0145  NA 140 138  K 3.9 3.1*  CL 107 105  CO2 24 26  GLUCOSE 132* 83  BUN 15 12  CREATININE 1.46* 1.10  CALCIUM 9.9 8.9   Liver Function Tests:  Recent Labs  03/11/17 1106 03/12/17 0145  AST 39 30  ALT 30 23  ALKPHOS 57 42  BILITOT 1.4* 1.0  PROT 7.8 6.4*  ALBUMIN 4.7 3.5    Recent Labs  03/11/17 1106  LIPASE 36   CBC:  Recent Labs  03/11/17 1106 03/12/17 0145  WBC 15.4* 10.6  NEUTROABS 12.5*  --   HGB 15.7 13.5  HCT 46.0 39.5*  MCV 91.2 91.6  PLT 286 242   Cardiac Enzymes:  Recent Labs  03/11/17 1653 03/11/17 2248 03/12/17 0145  TROPONINI <0.03 <0.03 <0.03   BNP: Invalid input(s): POCBNP D-Dimer: No results for input(s): DDIMER in the last 72 hours. Hemoglobin A1C: No results for input(s): HGBA1C in the last 72  hours. Fasting Lipid Panel: No results for input(s): CHOL, HDL, LDLCALC, TRIG, CHOLHDL, LDLDIRECT in the last 72 hours. Thyroid Function Tests: No results for input(s): TSH, T4TOTAL, T3FREE, THYROIDAB in the last 72 hours.  Invalid input(s): FREET3 Anemia Panel: No results for input(s): VITAMINB12, FOLATE, FERRITIN, TIBC, IRON, RETICCTPCT in the last 72 hours.  Dg Abd 1 View  Result Date: 03/11/2017 CLINICAL DATA:  Right flank region pain EXAM: ABDOMEN - 1 VIEW COMPARISON:  CT abdomen and pelvis obtained earlier in the day FINDINGS: There is contrast in the renal collecting systems and urinary bladder. There is mild dilatation of the right collecting system and ureter. No calculus is evident by radiography currently. There is moderate stool in the colon. There is no bowel dilatation or air-fluid level suggesting bowel obstruction. No free air. IMPRESSION: Mild fullness of the right ureter and collecting system. No calculus is seen by radiography. The presence of intravenous contrast could mask a small calculus. No bowel obstruction or free air evident. Electronically Signed  By: Bretta Bang III M.D.   On: 03/11/2017 16:30   Ct Angio Chest/abd/pel For Dissection W And/or Wo Contrast  Result Date: 03/11/2017 CLINICAL DATA:  Elevated blood pressure with chest, abdomen, and pelvis pain EXAM: CT ANGIOGRAPHY CHEST, ABDOMEN AND PELVIS TECHNIQUE: Initially, axial CT images were obtained through the chest without intravenous contrast material administration. Multidetector CT imaging through the chest, abdomen and pelvis was performed using the standard protocol during bolus administration of intravenous contrast. Multiplanar reconstructed images and MIPs were obtained and reviewed to evaluate the vascular anatomy. CONTRAST:  125 mL Isovue 370 nonionic COMPARISON:  None. FINDINGS: CTA CHEST FINDINGS Cardiovascular: There is no intramural hematoma in the thoracic aorta appreciable on the noncontrast  enhanced study. There is no appreciable thoracic aortic aneurysm or dissection. Visualized great vessels appear normal. There is no appreciable pericardial effusion. No pulmonary embolus evident. There is left ventricular hypertrophy. Mediastinum/Nodes: Visualized thyroid appears normal. There is no appreciable thoracic adenopathy. Lungs/Pleura: Lungs are clear. No pleural effusion or pleural thickening. Musculoskeletal: No blastic or lytic bone lesions. No chest wall lesion evident. Review of the MIP images confirms the above findings. CTA ABDOMEN AND PELVIS FINDINGS VASCULAR Aorta: There is no appreciable thoracic aortic aneurysm or dissection. There is atherosclerotic calcification in the distal aorta without hemodynamically significant obstruction. Celiac: There is no aneurysm or dissection. No appreciable obstructive disease noted in the celiac axis and major branches. SMA: There is no aneurysm or dissection. No appreciable obstructive disease in the superior mesenteric artery and major branches. Renals: There are single renal arteries bilaterally. There is mild calcification in the proximal right renal artery without hemodynamically significant obstruction. No evidence of aneurysm or dissection. No demonstrable fibromuscular dysplasia. IMA: No aneurysm or dissection. No appreciable obstruction in the inferior mesenteric artery and major branches. Inferior mesenteric artery is rather diminutive in a generalized manner. Inflow: There is atherosclerotic calcification at the origins of each common iliac artery without hemodynamically significant obstruction. There is mild calcification at the origin of each internal iliac artery. There is slight calcification in the right common femoral artery. No hemodynamically significant obstruction is seen in major pelvic arterial vessels. No aneurysm or dissection. Veins: No obvious venous abnormality within the limitations of this arterial phase study. Review of the MIP  images confirms the above findings. NON-VASCULAR Hepatobiliary: No focal liver lesions are appreciable. Gallbladder wall is not appreciably thickened. There is no biliary duct dilatation. Pancreas: No pancreatic mass or inflammatory focus. Spleen: Splenic lesions evident. Adrenals/Urinary Tract: Adrenals appear normal bilaterally. There is perinephric edema diffusely on the right. Right kidney shows decreased enhancement compared to the left side which appears normal. There is no renal mass on either side. There is moderately severe hydronephrosis on the right. No hydronephrosis on the left. There is no intrarenal calculus on either side. There is a calculus measuring 2 mm in size at the right ureterovesical junction. No other ureteral calculi evident. Urinary bladder midline without wall thickening. Stomach/Bowel: No bowel wall or mesenteric thickening. No evident bowel obstruction. No free air or portal venous air. Lymphatic: No adenopathy is appreciable in the abdomen or pelvis. There are a few subcentimeter retroperitoneal lymph nodes which do not meet size criteria for pathologic significance. Reproductive: Prostate and seminal vesicles are normal in size and contour. No pelvic mass. Other: Appendix appears normal. No abscess or ascites evident in the abdomen or pelvis. There is a small ventral hernia containing only fat. Musculoskeletal: There are no blastic or lytic bone lesions.  There is no intramuscular or abdominal wall lesion. Review of the MIP images confirms the above findings. IMPRESSION: CT angiogram chest: No thoracic aortic aneurysm or dissection. No evident pulmonary embolus. Lungs clear. No adenopathy. There is left ventricular hypertrophy. CT angiogram abdomen and pelvis: There is atherosclerotic calcification in the distal aorta and several pelvic vessels without hemodynamically significant obstruction. No aneurysm or dissection appreciable the aorta or mesenteric and pelvic arterial vessels. 2  mm calculus right ureterovesical junction causing hydronephrosis and ureterectasis on the right as well as right perinephric fluid and edema. Right kidney shows decreased enhancement compared to the left side consistent with the obstructive phenomenon. Small ventral hernia containing only fat. Appendix appears normal.  No abscess or bowel obstruction. These results were called by telephone at the time of interpretation on 03/11/2017 at 12:03 pm to Dr. Sharyn Creamer , who verbally acknowledged these results. Electronically Signed   By: Bretta Bang III M.D.   On: 03/11/2017 12:03     Echo   TELEMETRY: Sinus rhythm:  ASSESSMENT AND PLAN:  Principal Problem:   Accelerated hypertension Active Problems:   Abnormal EKG   Kidney stone   Acute back pain   Right flank pain   Right ureteral calculus   CKD (chronic kidney disease) stage 3, GFR 30-59 ml/min    1. Abnormal ECG, with nondiagnostic ST elevation, and the absence of chest pain, with negative troponin 3 in the setting of obstructive right kidney stone 2. Accelerated hypertension, secondary to noncompliance with antihypertensive medications, improved on current BP medications  Recommendations  1. Agree with current therapy 2. Defer further cardiac diagnostics at this time 3. Strongly encouraged patient to be compliant with BP medications  Sign off for now, please call if any questions   Marcina Millard, MD, PhD, [facility] 03/12/2017 11:05 AM

## 2017-03-12 NOTE — Progress Notes (Signed)
Denies pain. No stone observed in urine strainer with urine dark yellow. BP controlled. Diet advanced to heart healthy.

## 2017-03-12 NOTE — Discharge Instructions (Signed)
Low salt diet  Activity as tolerated  Can return to work without any restrictions

## 2017-03-12 NOTE — Progress Notes (Signed)
Pt given his oral K+ with NTG paste applied with education done. Ready for discharge home to self care. Transported in transport chair out to visitors entrance.

## 2017-03-21 NOTE — Discharge Summary (Signed)
[facility] - [place] [facility]   PATIENT NAME: Derek English Giangregorio    MR#:  [id number]  DATE OF BIRTH:  26-Jan-1971  DATE OF ADMISSION:  03/11/2017 ADMITTING PHYSICIAN: Marguarite ArbourJeffrey D Sparks, MD  DATE OF DISCHARGE: 03/12/2017 11:38 AM  PRIMARY CARE PHYSICIAN: Patient, No Pcp Per   ADMISSION DIAGNOSIS:  Kidney stone [N20.0] Right flank pain [R10.9] Hypertensive urgency [I16.0] ST elevation on ECG [R94.31]  DISCHARGE DIAGNOSIS:  Principal Problem:   Accelerated hypertension Active Problems:   Abnormal EKG   Kidney stone   Acute back pain   Right flank pain   Right ureteral calculus   CKD (chronic kidney disease) stage 3, GFR 30-59 ml/min   SECONDARY DIAGNOSIS:   Past Medical History:  Diagnosis Date  . Hypertension      ADMITTING HISTORY  HPI: Derek English Attar is a 46 y.o. male has a past medical history significant for HTN who presents with worsening acute back pain.No fever. Denies CP or SOB. Pain radiates to groin. No N/V/D. No hematuria. In ER, BP extremely elevated. EKG abnormal but 1st troponin normal. CT angio of chest/abd/pelvis shows right kidney stone with obstruction. He is now admitted. Denies any cardiac hx.  HOSPITAL COURSE:   * Right 2 mm ureteral stone with mild hydronephrosis Patient was admitted for pain management. Seen by urology. Patient's pain improved by day of discharge. He likely had passed a stone. Did not need any antibiotics. Started on Flomax which will be continued after discharge. Seen by urology.  * Accelerated hypertension with EKG changes. EKG changes were thought to be chronic. Troponin remained normal. He was started on lisinopril and hydrochlorothiazide with which his blood pressure is improved. Prescription given.  With primary care physician in one week.  CONSULTS OBTAINED:  Treatment Team:  Marcina MillardParaschos, Alexander, MD Vanna ScotlandBrandon, Ashley, MD  DRUG ALLERGIES:   Allergies  Allergen Reactions  . Penicillins      DISCHARGE MEDICATIONS:   Discharge Medication List as of 03/12/2017  9:47 AM    START taking these medications   Details  lisinopril-hydrochlorothiazide (ZESTORETIC) 10-12.5 MG tablet Take 1 tablet by mouth daily., Starting Sun 03/12/2017, Print    tamsulosin (FLOMAX) 0.4 MG CAPS capsule Take 1 capsule (0.4 mg total) by mouth daily., Starting Mon 03/13/2017, Print      CONTINUE these medications which have NOT CHANGED   Details  naproxen sodium (ANAPROX) 220 MG tablet Take 440 mg by mouth 2 (two) times daily with a meal., Historical Med        Today   VITAL SIGNS:  Blood pressure (!) 165/86, pulse 72, temperature 98.1 F (36.7 C), temperature source Oral, resp. rate 20, height 6' (1.829 m), weight (!) 142.7 kg (314 lb 9.6 oz), SpO2 100 %.  I/O:  No intake or output data in the 24 hours ending 03/21/17 1446  PHYSICAL EXAMINATION:  Physical Exam  GENERAL:  46 y.o.-year-old patient lying in the bed with no acute distress.  LUNGS: Normal breath sounds bilaterally, no wheezing, rales,rhonchi or crepitation. No use of accessory muscles of respiration.  CARDIOVASCULAR: S1, S2 normal. No murmurs, rubs, or gallops.  ABDOMEN: Soft, non-tender, non-distended. Bowel sounds present. No organomegaly or mass.  NEUROLOGIC: Moves all 4 extremities. PSYCHIATRIC: The patient is alert and oriented x 3.  SKIN: No obvious rash, lesion, or ulcer.   DATA REVIEW:   CBC No results for input(s): WBC, HGB, HCT, PLT in the last 168 hours.  Chemistries  No results for input(s): NA, K,  CL, CO2, GLUCOSE, BUN, CREATININE, CALCIUM, MG, AST, ALT, ALKPHOS, BILITOT in the last 168 hours.  Invalid input(s): GFRCGP  Cardiac Enzymes No results for input(s): TROPONINI in the last 168 hours.  Microbiology Results  No results found for this or any previous visit.  RADIOLOGY:  No results found.  Follow up with PCP in 1 week.  Management plans discussed with the patient, family and they are in  agreement.  CODE STATUS:  Code Status History    Date Active Date Inactive Code Status Order ID Comments User Context   03/11/2017  1:53 PM 03/12/2017  4:27 PM Full Code [id number]  Marguarite Arbour, MD ED      TOTAL TIME TAKING CARE OF THIS PATIENT ON DAY OF DISCHARGE: more than 30 minutes.   Milagros Loll R M.D on 03/21/2017 at 2:46 PM  Between 7am to 6pm - Pager - [phone]  After 6pm go to [email or web address] - password [email or web address]  [facility]  Office  [phone]  CC: Primary care physician; Patient, No Pcp Per  Note: This dictation was prepared with Dragon dictation along with smaller phrase technology. Any transcriptional errors that result from this process are unintentional.

## 2018-02-22 IMAGING — CT CT ANGIO CHEST-ABD-PELV FOR DISSECTION W/ AND WO/W CM
2 of 7 series · 12 of 46 positions shown, 14 images · IV contrast (isovue)
Comparison: None.

CLINICAL DATA: Elevated blood pressure with chest, abdomen, and
pelvis pain

EXAM:
CT ANGIOGRAPHY CHEST, ABDOMEN AND PELVIS
TECHNIQUE: Initially, axial CT images were obtained through the chest without
intravenous contrast material administration. Multidetector CT
imaging through the chest, abdomen and pelvis was performed using
the standard protocol during bolus administration of intravenous
contrast. Multiplanar reconstructed images and MIPs were obtained
and reviewed to evaluate the vascular anatomy.
CONTRAST:  125 mL Isovue 370 nonionic

[Series 5: axial arterial · axial · arterial · 0.93mm/px · z∈[-1120,-574]mm · 9 of 226 slices shown, 11 images]
[im 22/226  soft-tissue]
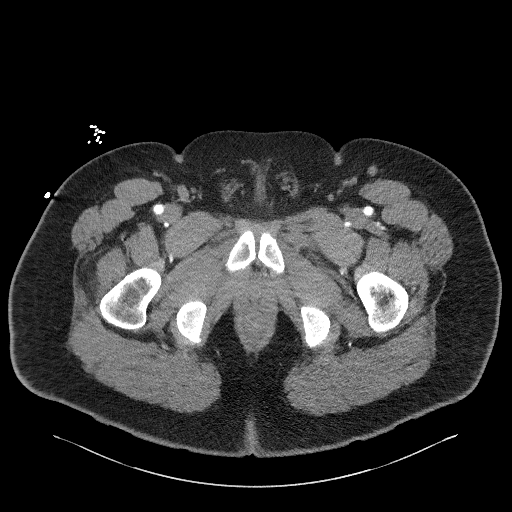
[im 22/226  bone]
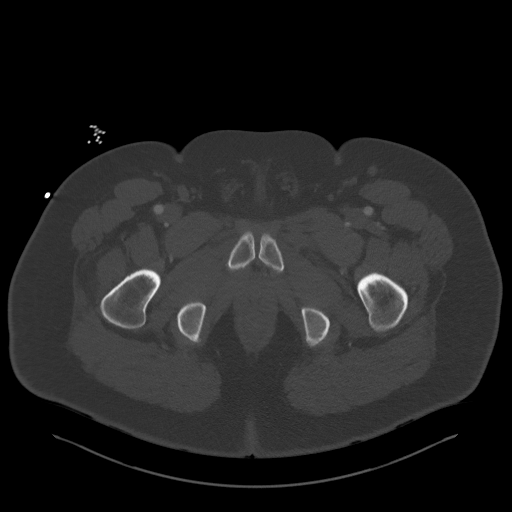
[im 43/226  soft-tissue]
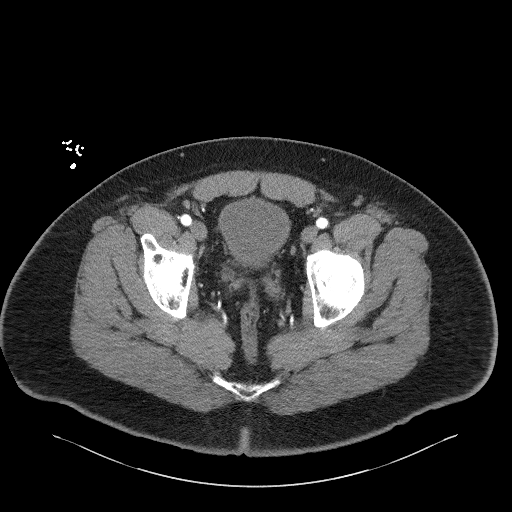
[im 65/226  soft-tissue]
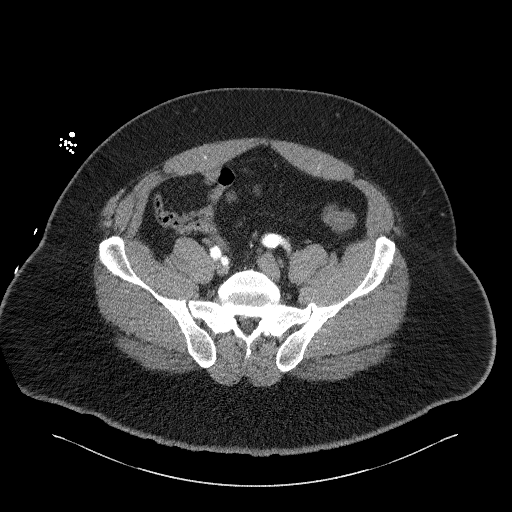
[im 86/226  soft-tissue]
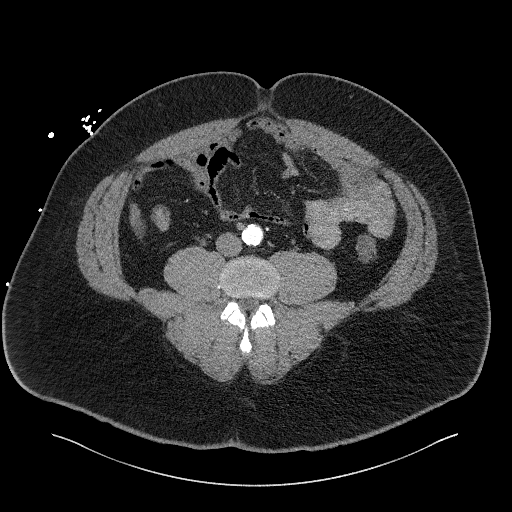
[im 118/226  soft-tissue]
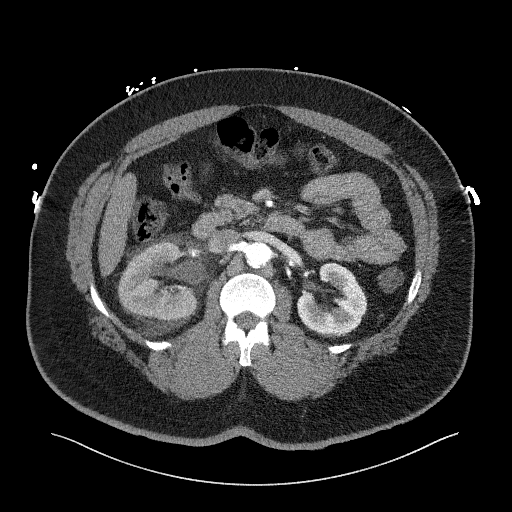
[im 140/226  soft-tissue]
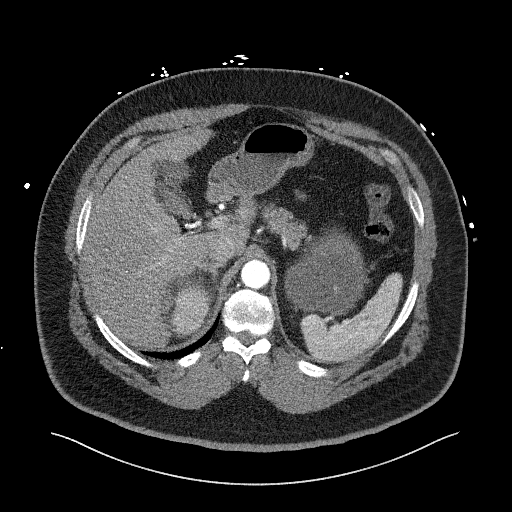
[im 161/226  soft-tissue]
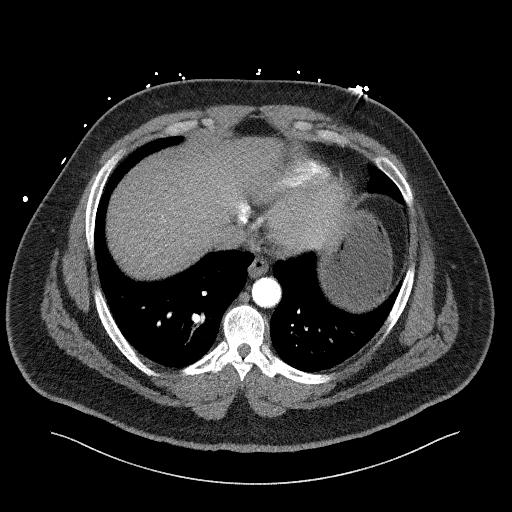
[im 183/226  soft-tissue]
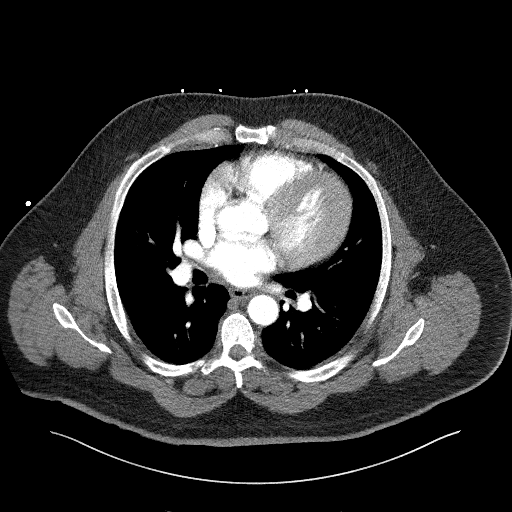
[im 204/226  soft-tissue]
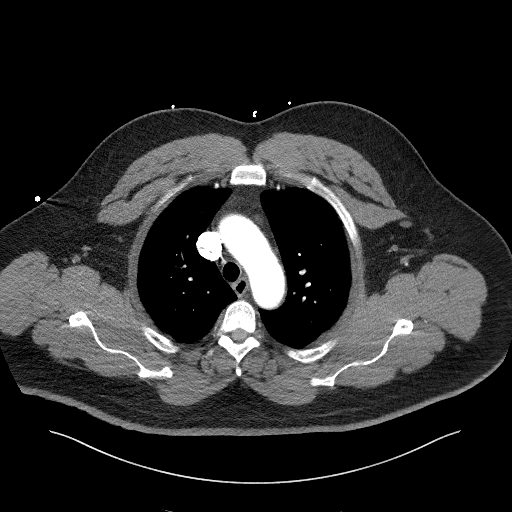
[im 204/226  bone]
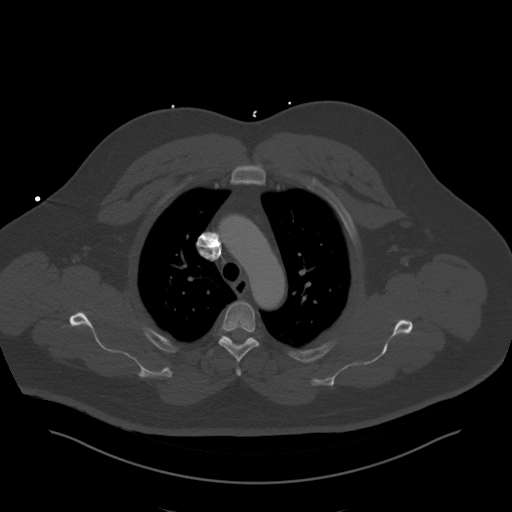

[Series 8: coronals · coronal · 0.88mm/px · 3 of 172 slices shown]
[im 43/172  soft-tissue]
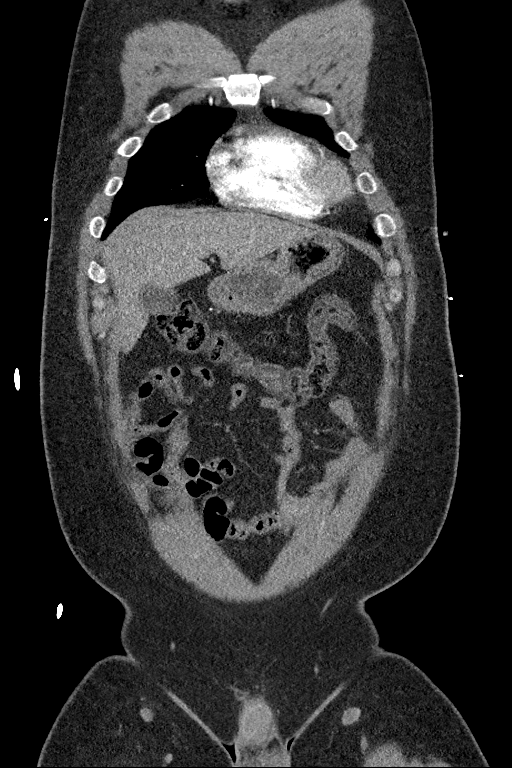
[im 86/172  soft-tissue]
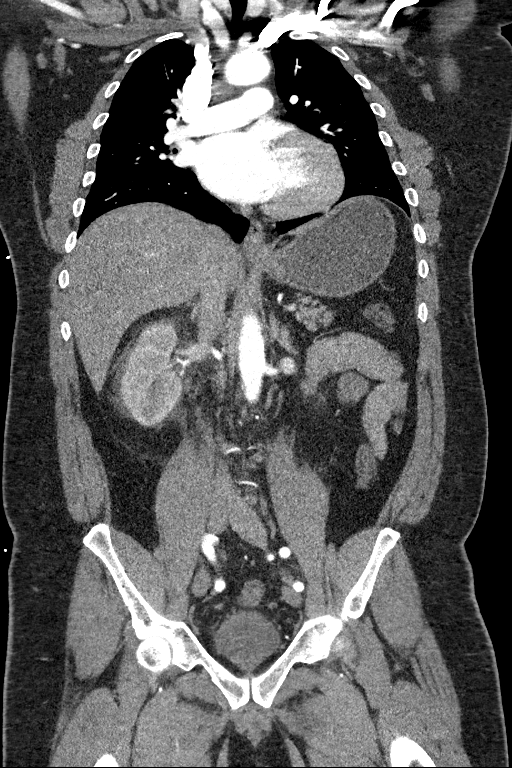
[im 129/172  soft-tissue]
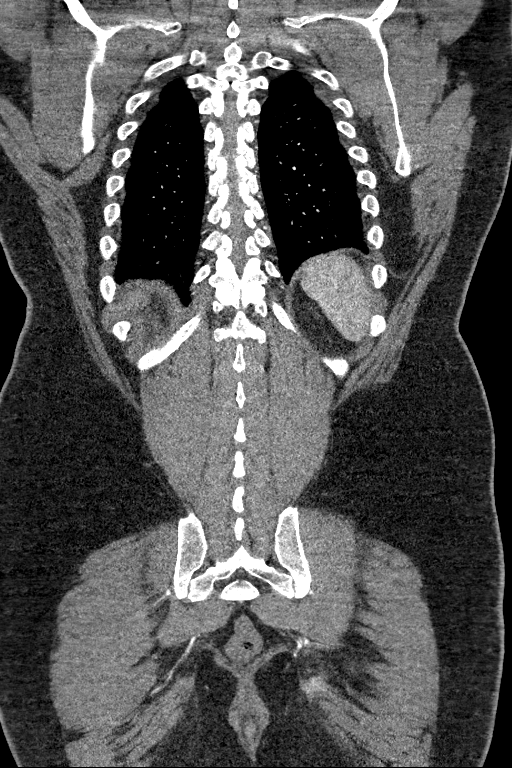

[12 of 46 positions shown; findings below may reference images not displayed]

FINDINGS: CTA CHEST FINDINGS

Cardiovascular: There is no intramural hematoma in the thoracic
aorta appreciable on the noncontrast enhanced study. There is no
appreciable thoracic aortic aneurysm or dissection. Visualized great
vessels appear normal. There is no appreciable pericardial effusion.
No pulmonary embolus evident. There is left ventricular hypertrophy.

Mediastinum/Nodes: Visualized thyroid appears normal. There is no
appreciable thoracic adenopathy.

Lungs/Pleura: Lungs are clear. No pleural effusion or pleural
thickening.

Musculoskeletal: No blastic or lytic bone lesions. No chest wall
lesion evident.

Review of the MIP images confirms the above findings.

CTA ABDOMEN AND PELVIS FINDINGS

VASCULAR

Aorta: There is no appreciable thoracic aortic aneurysm or
dissection. There is atherosclerotic calcification in the distal
aorta without hemodynamically significant obstruction.

Celiac: There is no aneurysm or dissection. No appreciable
obstructive disease noted in the celiac axis and major branches.

SMA: There is no aneurysm or dissection. No appreciable obstructive
disease in the superior mesenteric artery and major branches.

Renals: There are single renal arteries bilaterally. There is mild
calcification in the proximal right renal artery without
hemodynamically significant obstruction. No evidence of aneurysm or
dissection. No demonstrable fibromuscular dysplasia.

IMA: No aneurysm or dissection. No appreciable obstruction in the
inferior mesenteric artery and major branches. Inferior mesenteric
artery is rather diminutive in a generalized manner.

Inflow: There is atherosclerotic calcification at the origins of
each common iliac artery without hemodynamically significant
obstruction. There is mild calcification at the origin of each
internal iliac artery. There is slight calcification in the right
common femoral artery. No hemodynamically significant obstruction is
seen in major pelvic arterial vessels. No aneurysm or dissection.

Veins: No obvious venous abnormality within the limitations of this
arterial phase study.

Review of the MIP images confirms the above findings.

NON-VASCULAR

Hepatobiliary: No focal liver lesions are appreciable. Gallbladder
wall is not appreciably thickened. There is no biliary duct
dilatation.

Pancreas: No pancreatic mass or inflammatory focus.

Spleen: Splenic lesions evident.

Adrenals/Urinary Tract: Adrenals appear normal bilaterally. There is
perinephric edema diffusely on the right. Right kidney shows
decreased enhancement compared to the left side which appears
normal. There is no renal mass on either side. There is moderately
severe hydronephrosis on the right. No hydronephrosis on the left.
There is no intrarenal calculus on either side. There is a calculus
measuring 2 mm in size at the right ureterovesical junction. No
other ureteral calculi evident. Urinary bladder midline without wall
thickening.

Stomach/Bowel: No bowel wall or mesenteric thickening. No evident
bowel obstruction. No free air or portal venous air.

Lymphatic: No adenopathy is appreciable in the abdomen or pelvis.
There are a few subcentimeter retroperitoneal lymph nodes which do
not meet size criteria for pathologic significance.

Reproductive: Prostate and seminal vesicles are normal in size and
contour. No pelvic mass.

Other: Appendix appears normal. No abscess or ascites evident in the
abdomen or pelvis. There is a small ventral hernia containing only
fat.

Musculoskeletal: There are no blastic or lytic bone lesions. There
is no intramuscular or abdominal wall lesion.

Review of the MIP images confirms the above findings.
IMPRESSION: CT angiogram chest: No thoracic aortic aneurysm or dissection. No
evident pulmonary embolus. Lungs clear. No adenopathy. There is left
ventricular hypertrophy.

CT angiogram abdomen and pelvis: There is atherosclerotic
calcification in the distal aorta and several pelvic vessels without
hemodynamically significant obstruction. No aneurysm or dissection
appreciable the aorta or mesenteric and pelvic arterial vessels.

2 mm calculus right ureterovesical junction causing hydronephrosis
and ureterectasis on the right as well as right perinephric fluid
and edema. Right kidney shows decreased enhancement compared to the
left side consistent with the obstructive phenomenon.

Small ventral hernia containing only fat.

Appendix appears normal.  No abscess or bowel obstruction.

These results were called by telephone at the time of interpretation
on 03/11/2017 at [DATE] to Dr. LUBNA SALIBA , who verbally
acknowledged these results.

## 2021-07-26 ENCOUNTER — Encounter (HOSPITAL_COMMUNITY): Payer: Self-pay | Admitting: *Deleted

## 2021-07-26 ENCOUNTER — Emergency Department (HOSPITAL_COMMUNITY)
Admission: EM | Admit: 2021-07-26 | Discharge: 2021-07-26 | Disposition: A | Discharge status: Home / Self Care | Payer: Managed Care, Other (non HMO) | Attending: Emergency Medicine | Admitting: Emergency Medicine

## 2021-07-26 ENCOUNTER — Emergency Department (HOSPITAL_COMMUNITY): Payer: Managed Care, Other (non HMO)

## 2021-07-26 ENCOUNTER — Other Ambulatory Visit: Payer: Self-pay

## 2021-07-26 DIAGNOSIS — R0789 Other chest pain: Secondary | ICD-10-CM | POA: Diagnosis present

## 2021-07-26 DIAGNOSIS — N183 Chronic kidney disease, stage 3 unspecified: Secondary | ICD-10-CM | POA: Diagnosis not present

## 2021-07-26 DIAGNOSIS — I129 Hypertensive chronic kidney disease with stage 1 through stage 4 chronic kidney disease, or unspecified chronic kidney disease: Secondary | ICD-10-CM | POA: Insufficient documentation

## 2021-07-26 DIAGNOSIS — R079 Chest pain, unspecified: Secondary | ICD-10-CM

## 2021-07-26 DIAGNOSIS — Z79899 Other long term (current) drug therapy: Secondary | ICD-10-CM | POA: Diagnosis not present

## 2021-07-26 DIAGNOSIS — I10 Essential (primary) hypertension: Secondary | ICD-10-CM

## 2021-07-26 LAB — CBC
HCT: 44.4 % (ref 39.0–52.0)
Hemoglobin: 14.9 g/dL (ref 13.0–17.0)
MCH: 31 pg (ref 26.0–34.0)
MCHC: 33.6 g/dL (ref 30.0–36.0)
MCV: 92.3 fL (ref 80.0–100.0)
Platelets: 310 10*3/uL (ref 150–400)
RBC: 4.81 MIL/uL (ref 4.22–5.81)
RDW: 12.5 % (ref 11.5–15.5)
WBC: 6.1 10*3/uL (ref 4.0–10.5)
nRBC: 0 % (ref 0.0–0.2)

## 2021-07-26 LAB — COMPREHENSIVE METABOLIC PANEL
ALT: 25 U/L (ref 0–44)
AST: 26 U/L (ref 15–41)
Albumin: 3.8 g/dL (ref 3.5–5.0)
Alkaline Phosphatase: 56 U/L (ref 38–126)
Anion gap: 8 (ref 5–15)
BUN: 10 mg/dL (ref 6–20)
CO2: 26 mmol/L (ref 22–32)
Calcium: 9.6 mg/dL (ref 8.9–10.3)
Chloride: 102 mmol/L (ref 98–111)
Creatinine, Ser: 1.06 mg/dL (ref 0.61–1.24)
GFR, Estimated: >60 mL/min (ref >60)
Glucose, Bld: 238 mg/dL — ABNORMAL HIGH (ref 70–99)
Potassium: 3.5 mmol/L (ref 3.5–5.1)
Sodium: 136 mmol/L (ref 135–145)
Total Bilirubin: 0.8 mg/dL (ref 0.3–1.2)
Total Protein: 6.9 g/dL (ref 6.5–8.1)

## 2021-07-26 LAB — TROPONIN I (HIGH SENSITIVITY)
Troponin I (High Sensitivity): 7 ng/L (ref <18)
Troponin I (High Sensitivity): 7 ng/L (ref <18)

## 2021-07-26 MED ORDER — ACETAMINOPHEN 500 MG PO TABS
1000.0000 mg | ORAL_TABLET | Freq: Once | ORAL | Status: AC
  Administered 2021-07-26: 1000 mg via ORAL
  Filled 2021-07-26: qty 2

## 2021-07-26 NOTE — ED Triage Notes (Signed)
Pt reports left side chest pain that started yesterday. Denies sob or n/v. Bp 190/122 at triage, did not take his HTN meds this am.

## 2021-07-26 NOTE — ED Provider Notes (Signed)
[facility] EMERGENCY DEPARTMENT Provider Note   CSN: [id number] Arrival date & time: 07/26/21  1318     History Chief Complaint  Patient presents with  . Chest Pain    Davier Tramell is a 50 y.o. male.  Patient with history of high blood pressure compliant medications, cigarette smoking use presents with intermittent chest discomfort left lower.  At times deeper sensation other times feels similar to a muscle strain he said in the past.  No direct trauma or lifting recently.  No specific exertional component.  Patient denies blood clot risk factors.  No recent exertional component or diaphoresis.  Patient has not had a recent stress test.  No family history of known heart attack in parents less than age 48.      Past Medical History:  Diagnosis Date  . Hypertension     Patient Active Problem List   Diagnosis Date Noted  . Accelerated hypertension 03/11/2017  . Abnormal EKG 03/11/2017  . Kidney stone 03/11/2017  . Acute back pain 03/11/2017  . Right flank pain   . Right ureteral calculus   . CKD (chronic kidney disease) stage 3, GFR 30-59 ml/min (HCC)     History reviewed. No pertinent surgical history.     History reviewed. No pertinent family history.  Social History   Tobacco Use  . Smoking status: Never  . Smokeless tobacco: Never    Home Medications Prior to Admission medications   Medication Sig Start Date End Date Taking? Authorizing Provider  acetaminophen (TYLENOL) 325 MG tablet Take 650 mg by mouth every 6 (six) hours as needed for headache or mild pain.   Yes [provider]  amLODipine (NORVASC) 10 MG tablet Take 10 mg by mouth daily.   Yes [provider]  lisinopril-hydrochlorothiazide (ZESTORETIC) 10-12.5 MG tablet Take 1 tablet by mouth daily. 03/12/17  Yes Sudini, Wardell Heath, MD  Multiple Vitamins-Minerals (ONE-A-DAY MENS HEALTH FORMULA PO) Take 1 tablet by mouth daily.   Yes [provider]  tamsulosin  (FLOMAX) 0.4 MG CAPS capsule Take 1 capsule (0.4 mg total) by mouth daily. Patient not taking: Reported on 07/26/2021 03/13/17   Milagros Loll, MD    Allergies    Penicillins  Review of Systems   Review of Systems  Constitutional:  Negative for chills and fever.  HENT:  Negative for congestion.   Eyes:  Negative for visual disturbance.  Respiratory:  Negative for shortness of breath.   Cardiovascular:  Positive for chest pain.  Gastrointestinal:  Negative for abdominal pain and vomiting.  Genitourinary:  Negative for dysuria and flank pain.  Musculoskeletal:  Negative for back pain, neck pain and neck stiffness.  Skin:  Negative for rash.  Neurological:  Negative for light-headedness and headaches.   Physical Exam Updated Vital Signs BP (!) 160/99   Pulse 80   Temp 98.6 F (37 C) (Oral)   Resp 20   SpO2 100%   Physical Exam Vitals and nursing note reviewed.  Constitutional:      General: He is not in acute distress.    Appearance: He is well-developed.  HENT:     Head: Normocephalic and atraumatic.     Mouth/Throat:     Mouth: Mucous membranes are moist.  Eyes:     General:        Right eye: No discharge.        Left eye: No discharge.     Conjunctiva/sclera: Conjunctivae normal.  Neck:     Trachea: No  tracheal deviation.  Cardiovascular:     Rate and Rhythm: Normal rate and regular rhythm.     Heart sounds: No murmur heard. Pulmonary:     Effort: Pulmonary effort is normal.     Breath sounds: Normal breath sounds.  Abdominal:     General: There is no distension.     Palpations: Abdomen is soft.     Tenderness: There is no abdominal tenderness. There is no guarding.  Musculoskeletal:     Cervical back: Normal range of motion and neck supple. No rigidity.     Right lower leg: No tenderness. No edema.     Left lower leg: No tenderness. No edema.  Skin:    General: Skin is warm.     Capillary Refill: Capillary refill takes less than 2 seconds.     Findings:  No rash.  Neurological:     General: No focal deficit present.     Mental Status: He is alert.     Cranial Nerves: No cranial nerve deficit.  Psychiatric:        Mood and Affect: Mood normal.    ED Results / Procedures / Treatments   Labs (all labs ordered are listed, but only abnormal results are displayed) Labs Reviewed  COMPREHENSIVE METABOLIC PANEL - Abnormal; Notable for the following components:      Result Value   Glucose, Bld 238 (*)    All other components within normal limits  CBC  TROPONIN I (HIGH SENSITIVITY)  TROPONIN I (HIGH SENSITIVITY)    EKG EKG Interpretation  Date/Time:  Monday July 26 2021 13:32:53 EDT Ventricular Rate:  85 PR Interval:  170 QRS Duration: 108 QT Interval:  366 QTC Calculation: 435 R Axis:   -5 Text Interpretation: Normal sinus rhythm Normal ECG Confirmed by Blane Ohara [phone]) on 07/26/2021 4:36:47 PM  Radiology DG Chest 2 View  Result Date: 07/26/2021 CLINICAL DATA:  Chest pain. EXAM: CHEST - 2 VIEW COMPARISON:  None. FINDINGS: The heart size and mediastinal contours are within normal limits. Both lungs are clear. The visualized skeletal structures are unremarkable. IMPRESSION: No active cardiopulmonary disease. Electronically Signed   By: Lupita Raider M.D.   On: 07/26/2021 14:55    Procedures Procedures   Medications Ordered in ED Medications  acetaminophen (TYLENOL) tablet 1,000 mg (has no administration in time range)    ED Course  I have reviewed the triage vital signs and the nursing notes.  Pertinent labs & imaging results that were available during my care of the patient were reviewed by me and considered in my medical decision making (see chart for details).    MDM Rules/Calculators/A&P                           Patient presents with atypical chest discomfort since yesterday.  Patient has elevated blood pressure in the ER and has follow-up for this.  Symptoms minimal during exam.  Patient is  well-appearing and initial troponin and repeat troponin negative.  EKG no ischemic findings.  Chest x-ray reviewed no acute abnormalities.  Basic blood work unremarkable.  Patient does have a few risk factors for ACS and stressed importance of follow-up with local cardiology group later this week.  Reasons to return discussed.  No risk factors for blood clots, normal heart rate, no shortness of breath and normal oxygenation-very low pretest probability for pulmonary embolism and PERC negative.    Final Clinical Impression(s) / ED Diagnoses Final diagnoses:  Acute chest pain  Hypertension, unspecified type    Rx / DC Orders ED Discharge Orders     None        Blane Ohara, MD 07/26/21 1806

## 2021-07-26 NOTE — ED Provider Notes (Signed)
Emergency Medicine Provider Triage Evaluation Note  Derek English , a 50 y.o. male  was evaluated in triage.  Pt complains of chest pain.  Patient says chest pain started sometime yesterday afternoon or yesterday evening.  He is also on his left chest.  Is pleuritic.  Is positional.  States that it feels like he may have pulled something but he also feels like the pain is coming from inside.  No radiation.  So so the pain eased off a little bit this morning however around lunchtime again he started feeling like something was grabbing his chest.  He says that yesterday morning he did not take his blood pressure medications.  When he checked his blood pressure last night was 160/105.  He ended up taking his blood pressure meds at 9 PM last night.  This morning he woke up and did not take his medications again since he had just taken them last night.  He denies any headache, vision changes, shortness of breath, abdominal pain, nausea, vomiting.  Review of Systems  Positive: Chest pain Negative: Shortness of breath, nausea, vomiting, vision changes, headache, numbness or tingling of any extremity, weakness of any extremity.  Physical Exam  BP (!) 190/122 (BP Location: Left Arm)   Pulse 88   Temp 98.6 F (37 C) (Oral)   Resp 18   SpO2 100%  Gen:   Awake, no distress  Resp:  Normal effort.  Lung sounds clear to auscultation. MSK:   Moves extremities without difficulty.  Other:  Chest pain not reproducible to palpation.  When patient leaned over to the left he did have sharp chest pain.  Pulses are 2+ in all distal extremities.  He is neurologically intact.  Medical Decision Making  Medically screening exam initiated at 1:58 PM.  Appropriate orders placed.  Lynn Sissel was informed that the remainder of the evaluation will be completed by another provider, this initial triage assessment does not replace that evaluation, and the importance of remaining in the ED until their evaluation is  complete.     Claudie Leach, PA-C 07/26/21 1401    Sloan Leiter, DO 07/26/21 1752

## 2021-07-26 NOTE — Discharge Instructions (Addendum)
Use Tylenol every 4 hours as needed for pain. Your heart tests were normal in the ED but you may need a stress test with the heart doctor.  You can take baby aspirin 81 mg daily until you see the heart doctor/primary doctor later this week. Return for new or worsening signs or symptoms.

## 2022-07-09 IMAGING — CR DG CHEST 2V
2 series · 2 of 2 positions shown · non-contrast
Comparison: None.

CLINICAL DATA: Chest pain.

EXAM:
CHEST - 2 VIEW

[chest pa]
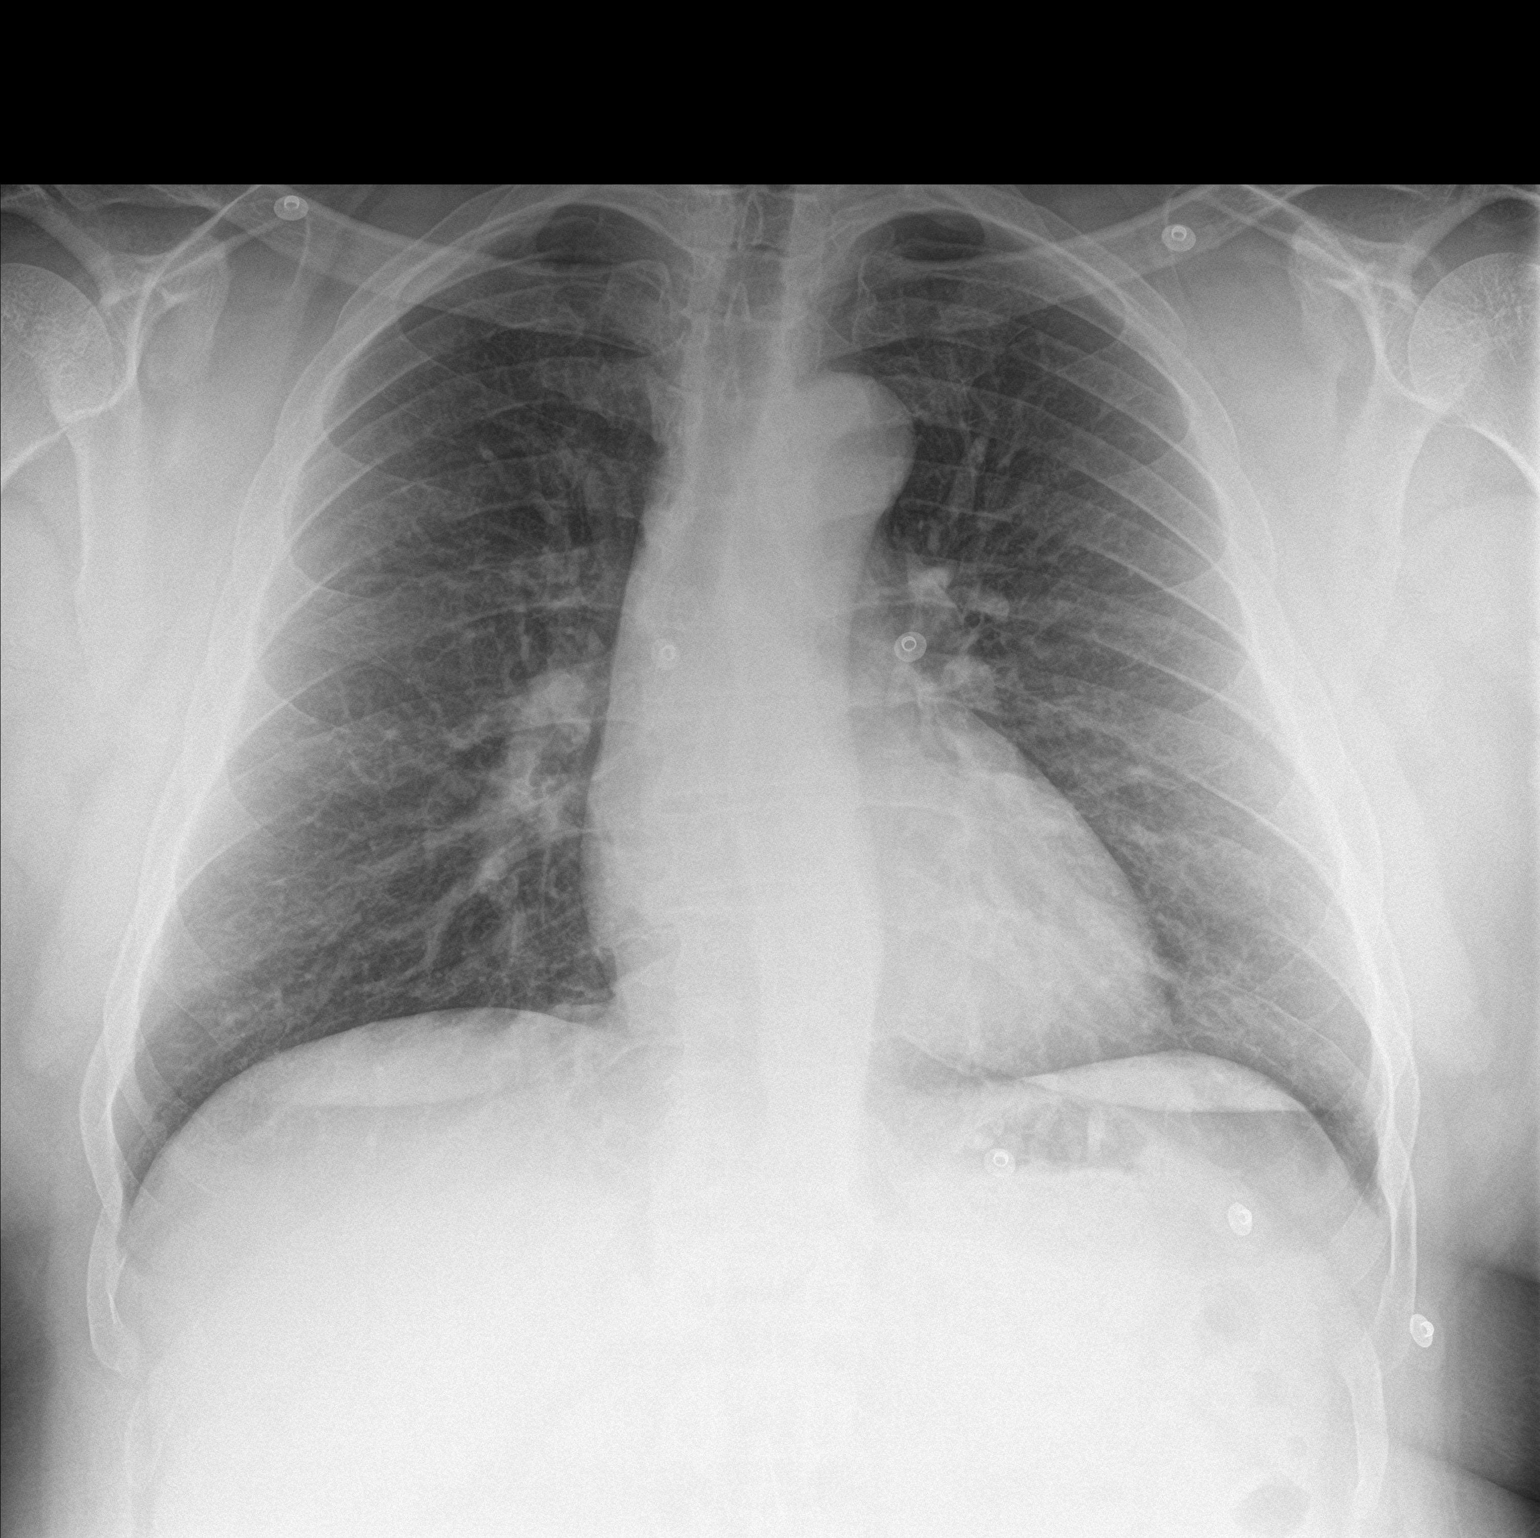

[chest lat]
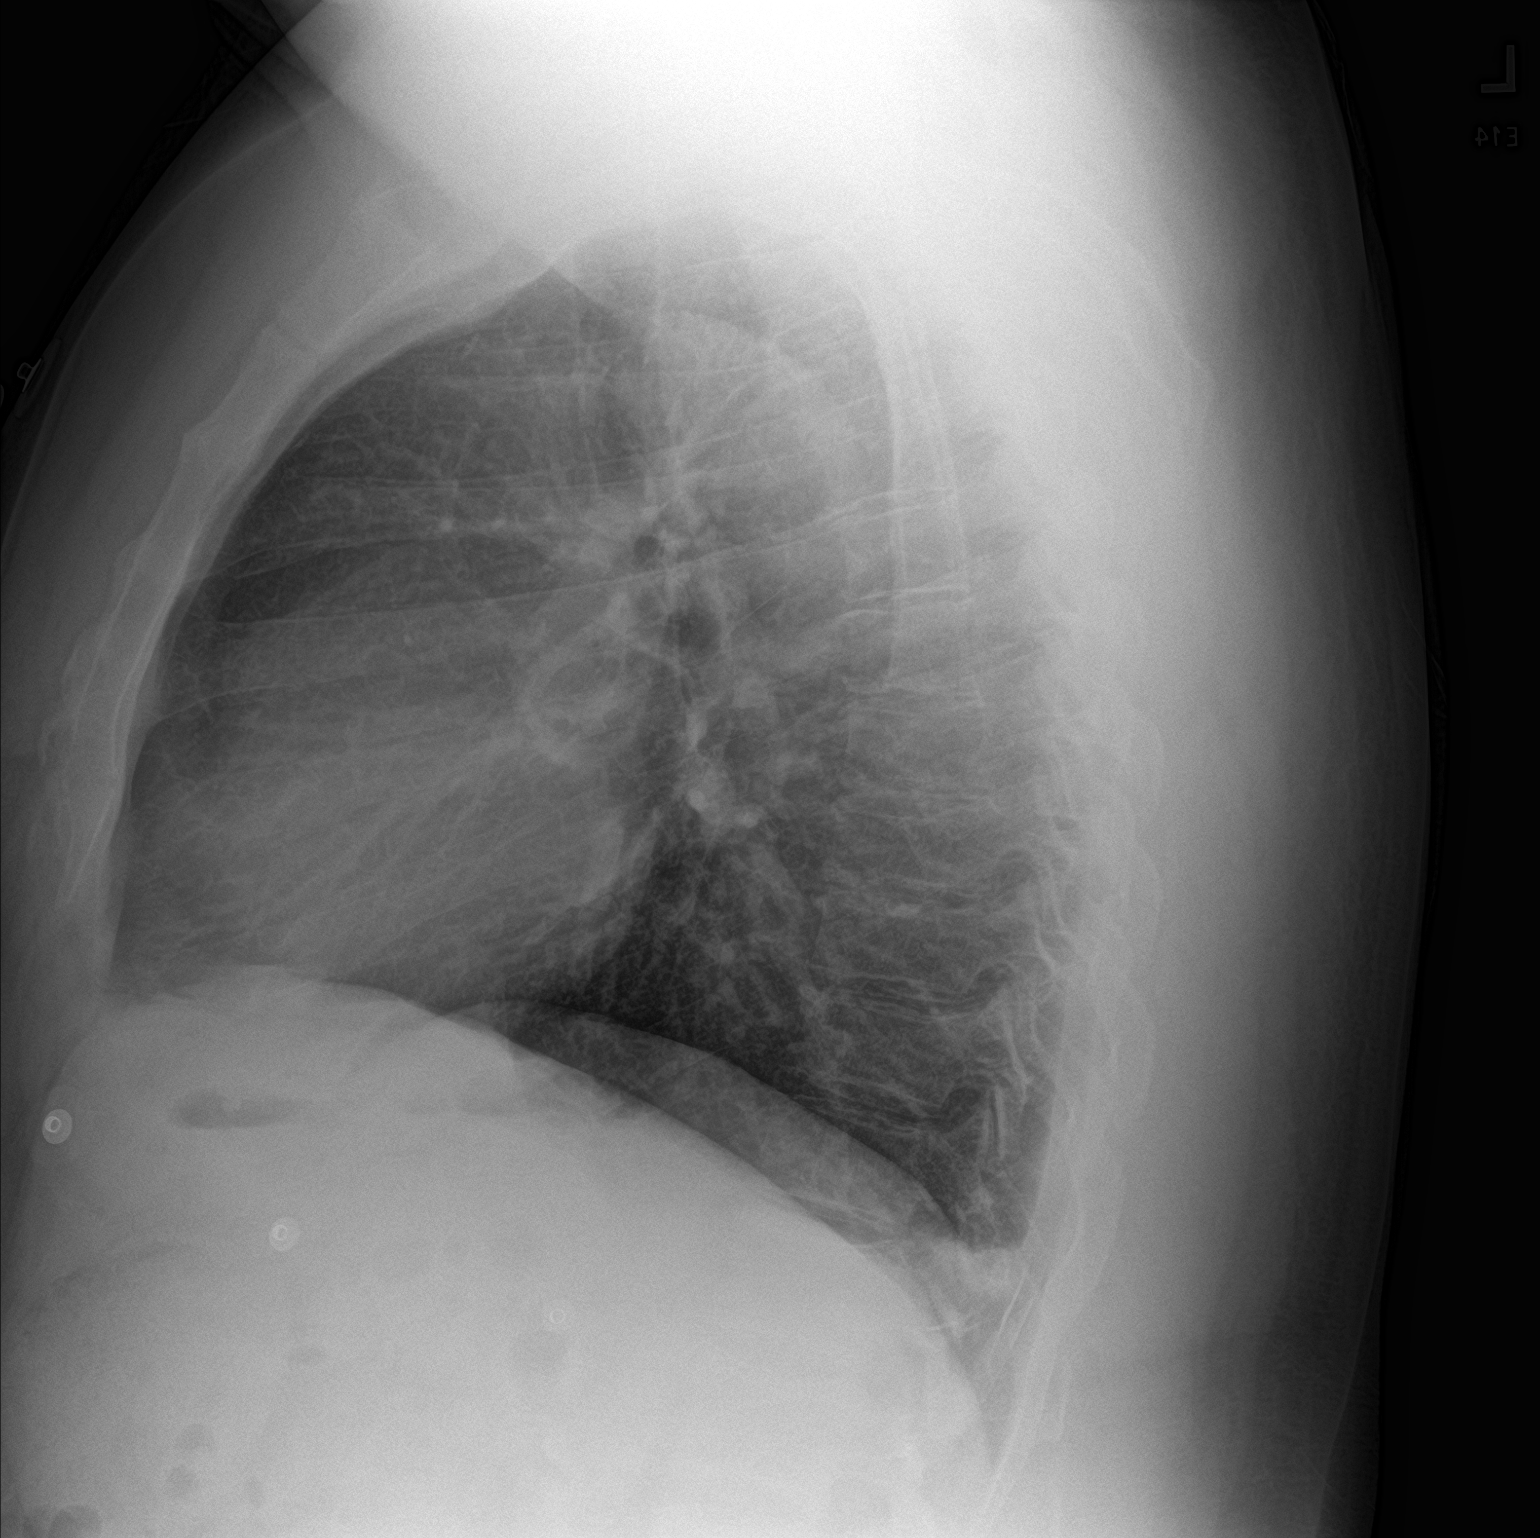

[2 of 2 positions shown; findings below may reference images not displayed]

FINDINGS: The heart size and mediastinal contours are within normal limits.
Both lungs are clear. The visualized skeletal structures are
unremarkable.
IMPRESSION: No active cardiopulmonary disease.

## 2023-09-09 ENCOUNTER — Ambulatory Visit
Admission: EM | Admit: 2023-09-09 | Discharge: 2023-09-09 | Disposition: A | Discharge status: Home / Self Care | Payer: BC Managed Care – PPO | Attending: Family Medicine | Admitting: Family Medicine

## 2023-09-09 ENCOUNTER — Ambulatory Visit: Payer: BC Managed Care – PPO

## 2023-09-09 ENCOUNTER — Encounter: Payer: Self-pay | Admitting: Emergency Medicine

## 2023-09-09 DIAGNOSIS — M79645 Pain in left finger(s): Secondary | ICD-10-CM

## 2023-09-09 DIAGNOSIS — M25522 Pain in left elbow: Secondary | ICD-10-CM | POA: Diagnosis not present

## 2023-09-09 DIAGNOSIS — M549 Dorsalgia, unspecified: Secondary | ICD-10-CM | POA: Diagnosis not present

## 2023-09-09 DIAGNOSIS — I10 Essential (primary) hypertension: Secondary | ICD-10-CM | POA: Diagnosis not present

## 2023-09-09 MED ORDER — KETOROLAC TROMETHAMINE 30 MG/ML IJ SOLN
30.0000 mg | Freq: Once | INTRAMUSCULAR | Status: AC
  Administered 2023-09-09: 30 mg via INTRAMUSCULAR

## 2023-09-09 MED ORDER — TIZANIDINE HCL 4 MG PO TABS: 4.0000 mg | ORAL_TABLET | Freq: Three times a day (TID) | ORAL | 0 refills | Status: AC | PRN

## 2023-09-09 MED ORDER — KETOROLAC TROMETHAMINE 10 MG PO TABS: 10.0000 mg | ORAL_TABLET | Freq: Four times a day (QID) | ORAL | 0 refills | Status: AC | PRN

## 2023-09-09 NOTE — Discharge Instructions (Signed)
By my review the x-ray of the ring finger shows a possible abnormality along the middle phalanx of the ring finger. The rib x-rays and the elbow x-ray looked normal to me.  The radiologist will also read your x-ray, and if their interpretation differs significantly from mine, we will call you.  You have been given a shot of Toradol 30 mg today.  Ketorolac 10 mg tablets--take 1 tablet every 6 hours as needed for pain.  This is the same medicine that is in the shot we just gave you   Take tizanidine 4 mg--1 every 8 hours as needed for muscle spasms; this medication can cause dizziness and sleepiness
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
